# Patient Record
Sex: Female | Born: 1950
Health system: Southern US, Community
[De-identification: ages and names within clinical notes are randomized; demographics above are authoritative.]

## PROBLEM LIST (undated history)

## (undated) DIAGNOSIS — N951 Menopausal and female climacteric states: Secondary | ICD-10-CM

## (undated) DIAGNOSIS — M797 Fibromyalgia: Secondary | ICD-10-CM

## (undated) DIAGNOSIS — E785 Hyperlipidemia, unspecified: Secondary | ICD-10-CM

## (undated) DIAGNOSIS — B029 Zoster without complications: Secondary | ICD-10-CM

## (undated) HISTORY — DX: Fibromyalgia: M79.7

## (undated) HISTORY — DX: Hyperlipidemia, unspecified: E78.5

## (undated) HISTORY — DX: Zoster without complications: B02.9

## (undated) HISTORY — DX: Menopausal and female climacteric states: N95.1

## (undated) HISTORY — PX: KNEE ARTHROSCOPY: SUR90

---

## 1980-02-29 HISTORY — PX: TUBAL LIGATION: SHX77

## 1998-12-30 HISTORY — PX: LUMBAR DISC SURGERY: SHX700

## 1999-01-04 ENCOUNTER — Encounter: Payer: Self-pay | Admitting: Neurosurgery

## 1999-01-04 ENCOUNTER — Ambulatory Visit (HOSPITAL_COMMUNITY): Admission: RE | Admit: 1999-01-04 | Discharge: 1999-01-04 | Payer: Self-pay | Admitting: Neurosurgery

## 1999-04-08 ENCOUNTER — Encounter: Payer: Self-pay | Admitting: Family Medicine

## 1999-04-08 ENCOUNTER — Encounter: Admission: RE | Admit: 1999-04-08 | Discharge: 1999-04-08 | Payer: Self-pay | Admitting: Family Medicine

## 1999-07-05 ENCOUNTER — Other Ambulatory Visit: Admission: RE | Admit: 1999-07-05 | Discharge: 1999-07-05 | Payer: Self-pay | Admitting: Obstetrics and Gynecology

## 1999-07-05 ENCOUNTER — Encounter (INDEPENDENT_AMBULATORY_CARE_PROVIDER_SITE_OTHER): Payer: Self-pay | Admitting: Specialist

## 1999-07-05 HISTORY — PX: COLPOSCOPY W/ BIOPSY / CURETTAGE: SUR283

## 1999-07-22 HISTORY — PX: CERVICAL BIOPSY  W/ LOOP ELECTRODE EXCISION: SUR135

## 1999-07-23 ENCOUNTER — Encounter (INDEPENDENT_AMBULATORY_CARE_PROVIDER_SITE_OTHER): Payer: Self-pay | Admitting: Specialist

## 1999-07-23 ENCOUNTER — Other Ambulatory Visit: Admission: RE | Admit: 1999-07-23 | Discharge: 1999-07-23 | Payer: Self-pay | Admitting: Obstetrics and Gynecology

## 1999-11-19 ENCOUNTER — Other Ambulatory Visit: Admission: RE | Admit: 1999-11-19 | Discharge: 1999-11-19 | Payer: Self-pay | Admitting: Obstetrics and Gynecology

## 2000-03-16 ENCOUNTER — Other Ambulatory Visit: Admission: RE | Admit: 2000-03-16 | Discharge: 2000-03-16 | Payer: Self-pay | Admitting: Obstetrics and Gynecology

## 2000-04-12 ENCOUNTER — Encounter: Payer: Self-pay | Admitting: Obstetrics and Gynecology

## 2000-04-12 ENCOUNTER — Encounter: Admission: RE | Admit: 2000-04-12 | Discharge: 2000-04-12 | Payer: Self-pay | Admitting: Obstetrics and Gynecology

## 2000-07-14 ENCOUNTER — Other Ambulatory Visit: Admission: RE | Admit: 2000-07-14 | Discharge: 2000-07-14 | Payer: Self-pay | Admitting: Obstetrics and Gynecology

## 2000-11-17 ENCOUNTER — Other Ambulatory Visit: Admission: RE | Admit: 2000-11-17 | Discharge: 2000-11-17 | Payer: Self-pay | Admitting: Obstetrics and Gynecology

## 2001-04-02 ENCOUNTER — Other Ambulatory Visit: Admission: RE | Admit: 2001-04-02 | Discharge: 2001-04-02 | Payer: Self-pay | Admitting: Obstetrics and Gynecology

## 2001-04-27 ENCOUNTER — Encounter: Payer: Self-pay | Admitting: Obstetrics and Gynecology

## 2001-04-27 ENCOUNTER — Encounter: Admission: RE | Admit: 2001-04-27 | Discharge: 2001-04-27 | Payer: Self-pay | Admitting: Obstetrics and Gynecology

## 2001-10-12 ENCOUNTER — Other Ambulatory Visit: Admission: RE | Admit: 2001-10-12 | Discharge: 2001-10-12 | Payer: Self-pay | Admitting: Obstetrics and Gynecology

## 2002-04-16 ENCOUNTER — Other Ambulatory Visit: Admission: RE | Admit: 2002-04-16 | Discharge: 2002-04-16 | Payer: Self-pay | Admitting: Obstetrics and Gynecology

## 2002-04-29 ENCOUNTER — Encounter: Payer: Self-pay | Admitting: Obstetrics and Gynecology

## 2002-04-29 ENCOUNTER — Encounter: Admission: RE | Admit: 2002-04-29 | Discharge: 2002-04-29 | Payer: Self-pay | Admitting: Obstetrics and Gynecology

## 2002-10-15 ENCOUNTER — Other Ambulatory Visit: Admission: RE | Admit: 2002-10-15 | Discharge: 2002-10-15 | Payer: Self-pay | Admitting: Obstetrics and Gynecology

## 2003-01-06 ENCOUNTER — Encounter: Admission: RE | Admit: 2003-01-06 | Discharge: 2003-01-06 | Payer: Self-pay | Admitting: Obstetrics and Gynecology

## 2003-04-18 ENCOUNTER — Other Ambulatory Visit: Admission: RE | Admit: 2003-04-18 | Discharge: 2003-04-18 | Payer: Self-pay | Admitting: Obstetrics and Gynecology

## 2003-05-13 ENCOUNTER — Encounter: Admission: RE | Admit: 2003-05-13 | Discharge: 2003-05-13 | Payer: Self-pay | Admitting: Obstetrics and Gynecology

## 2003-10-28 ENCOUNTER — Other Ambulatory Visit: Admission: RE | Admit: 2003-10-28 | Discharge: 2003-10-28 | Payer: Self-pay | Admitting: Obstetrics and Gynecology

## 2004-04-19 ENCOUNTER — Other Ambulatory Visit: Admission: RE | Admit: 2004-04-19 | Discharge: 2004-04-19 | Payer: Self-pay | Admitting: Obstetrics and Gynecology

## 2004-04-20 ENCOUNTER — Ambulatory Visit: Admission: RE | Admit: 2004-04-20 | Discharge: 2004-04-20 | Payer: Self-pay | Admitting: Obstetrics and Gynecology

## 2004-05-26 ENCOUNTER — Encounter: Admission: RE | Admit: 2004-05-26 | Discharge: 2004-05-26 | Payer: Self-pay | Admitting: Obstetrics and Gynecology

## 2005-02-28 DIAGNOSIS — M797 Fibromyalgia: Secondary | ICD-10-CM

## 2005-02-28 HISTORY — DX: Fibromyalgia: M79.7

## 2005-03-29 ENCOUNTER — Encounter: Admission: RE | Admit: 2005-03-29 | Discharge: 2005-03-29 | Payer: Self-pay | Admitting: Neurosurgery

## 2005-04-20 ENCOUNTER — Other Ambulatory Visit: Admission: RE | Admit: 2005-04-20 | Discharge: 2005-04-20 | Payer: Self-pay | Admitting: Obstetrics and Gynecology

## 2005-05-27 ENCOUNTER — Encounter: Admission: RE | Admit: 2005-05-27 | Discharge: 2005-05-27 | Payer: Self-pay | Admitting: Obstetrics and Gynecology

## 2006-02-28 DIAGNOSIS — B029 Zoster without complications: Secondary | ICD-10-CM

## 2006-02-28 HISTORY — DX: Zoster without complications: B02.9

## 2006-04-21 ENCOUNTER — Other Ambulatory Visit: Admission: RE | Admit: 2006-04-21 | Discharge: 2006-04-21 | Payer: Self-pay | Admitting: Obstetrics & Gynecology

## 2006-05-30 ENCOUNTER — Encounter: Admission: RE | Admit: 2006-05-30 | Discharge: 2006-05-30 | Payer: Self-pay | Admitting: Obstetrics and Gynecology

## 2006-11-12 ENCOUNTER — Encounter: Admission: RE | Admit: 2006-11-12 | Discharge: 2006-11-12 | Payer: Self-pay | Admitting: Neurosurgery

## 2007-01-29 HISTORY — PX: LUMBAR DISC ARTHROPLASTY: SHX699

## 2007-02-19 ENCOUNTER — Ambulatory Visit (HOSPITAL_COMMUNITY): Admission: RE | Admit: 2007-02-19 | Discharge: 2007-02-19 | Payer: Self-pay | Admitting: Neurosurgery

## 2007-04-20 ENCOUNTER — Ambulatory Visit (HOSPITAL_COMMUNITY): Admission: RE | Admit: 2007-04-20 | Discharge: 2007-04-20 | Payer: Self-pay | Admitting: Orthopaedic Surgery

## 2007-05-25 ENCOUNTER — Other Ambulatory Visit: Admission: RE | Admit: 2007-05-25 | Discharge: 2007-05-25 | Payer: Self-pay | Admitting: Obstetrics and Gynecology

## 2007-06-04 ENCOUNTER — Encounter: Admission: RE | Admit: 2007-06-04 | Discharge: 2007-06-04 | Payer: Self-pay | Admitting: Obstetrics and Gynecology

## 2007-08-11 DIAGNOSIS — N951 Menopausal and female climacteric states: Secondary | ICD-10-CM

## 2007-08-11 HISTORY — DX: Menopausal and female climacteric states: N95.1

## 2008-05-26 ENCOUNTER — Other Ambulatory Visit: Admission: RE | Admit: 2008-05-26 | Discharge: 2008-05-26 | Payer: Self-pay | Admitting: Obstetrics and Gynecology

## 2008-06-04 ENCOUNTER — Encounter: Admission: RE | Admit: 2008-06-04 | Discharge: 2008-06-04 | Payer: Self-pay | Admitting: Obstetrics and Gynecology

## 2009-06-05 ENCOUNTER — Encounter: Admission: RE | Admit: 2009-06-05 | Discharge: 2009-06-05 | Payer: Self-pay | Admitting: Obstetrics & Gynecology

## 2009-10-28 ENCOUNTER — Encounter (INDEPENDENT_AMBULATORY_CARE_PROVIDER_SITE_OTHER): Payer: Self-pay | Admitting: *Deleted

## 2010-02-28 DIAGNOSIS — E785 Hyperlipidemia, unspecified: Secondary | ICD-10-CM

## 2010-02-28 HISTORY — DX: Hyperlipidemia, unspecified: E78.5

## 2010-03-30 NOTE — Letter (Signed)
Summary: Colonoscopy Date Change Letter  [facility]  [address] [place], [place] [address]   Phone: [phone]  Fax: [phone]      October 28, 2009 MRN: [id number]   [name] [address] [place], [place]  [address]   Dear Ms. REVERON,   Previously you were recommended to have a repeat colonoscopy around this time. Your chart was recently reviewed by Dr. Claudette Head of [facility]. Follow up colonoscopy is now recommended in September 2014. This revised recommendation is based on current, nationally recognized guidelines for colorectal cancer screening and polyp surveillance. These guidelines are endorsed by the American Cancer Society, [employer] on Colorectal Cancer as well as numerous other major medical organizations.  Please understand that our recommendation assumes that you do not have any new symptoms such as bleeding, a change in bowel habits, anemia, or significant abdominal discomfort. If you do have any concerning GI symptoms or want to discuss the guideline recommendations, please call to arrange an office visit at your earliest convenience. Otherwise we will keep you in our reminder system and contact you 1-2 months prior to the date listed above to schedule your next colonoscopy.  Thank you,  Judie Petit T. Russella Dar, M.D.  [facility] [phone]

## 2010-05-03 ENCOUNTER — Other Ambulatory Visit: Payer: Self-pay | Admitting: Nurse Practitioner

## 2010-05-03 DIAGNOSIS — Z1239 Encounter for other screening for malignant neoplasm of breast: Secondary | ICD-10-CM

## 2010-06-07 ENCOUNTER — Other Ambulatory Visit: Payer: Self-pay | Admitting: Obstetrics & Gynecology

## 2010-06-07 ENCOUNTER — Ambulatory Visit
Admission: RE | Admit: 2010-06-07 | Discharge: 2010-06-07 | Disposition: A | Discharge status: Home / Self Care | Payer: BLUE CROSS/BLUE SHIELD | Source: Ambulatory Visit | Attending: Nurse Practitioner | Admitting: Nurse Practitioner

## 2010-06-07 DIAGNOSIS — Z1239 Encounter for other screening for malignant neoplasm of breast: Secondary | ICD-10-CM

## 2010-07-13 NOTE — Op Note (Signed)
NAMEJOSSELYNE, [name]             ACCOUNT NO.:  [id number]   MEDICAL RECORD NO.:  [id number]          PATIENT TYPE:  AMB   LOCATION:  SDS                          FACILITY:  [facility]   PHYSICIAN:  Vanita Panda. Magnus Ivan, M.D.DATE OF BIRTH:  1950/04/10   DATE OF PROCEDURE:  04/20/2007  DATE OF DISCHARGE:  04/20/2007                               OPERATIVE REPORT   PREOPERATIVE DIAGNOSIS:  Right knee complex lateral meniscal tear.   POSTOPERATIVE DIAGNOSIS:  1. Right knee complex lateral meniscal tear.  2. Grade 4 chondromalacia right lateral femoral condyle and right      lateral tibial plateau.   PROCEDURE:  1. Right knee arthroscopy with extensive debridement.  2. Right knee partial lateral meniscectomy.  3. Right knee chondroplasty of lateral compartment.   SURGEON:  Vanita Panda. Magnus Ivan, M.D.   ANESTHESIA:  Local right knee block with IV sedation.   BLOOD LOSS:  Minimal.   COMPLICATIONS:  None.   INDICATIONS:  [name] is a very pleasant 60 year old female who said  that her knee really had not bothered her too much until since Christmas  when she started to have more pain in her knee and she was unable to  extend it and it was in a locked position.  An MRI was obtained and  showed complex degenerative changes in the lateral compartment and a  complex lateral meniscal tear.  Due to the inability to fully extend her  knee, it was recommend she undergo arthroscopic surgery to the knee to  address the lateral compartment.  The risks and benefits of the surgery  were explained to her and she agrees to proceed with surgery.   DESCRIPTION OF PROCEDURE:  After informed consent was obtained and the  appropriate right knee was marked, she was brought to the operating room  and placed supine on the operating table. A block was obtained by  anesthesia previously. IV sedation was obtained.  A non-sterile  tourniquet was placed around her right upper thigh but was never  utilized during the case.  Her right leg was prepped and draped with  DuraPrep and sterile drapes including a sterile stockinette. With the  bed raised and the leg flexed off the side of the table, a lateral leg  post was used, a time out was called, she was identified as the correct  patient and the correct extremity.  I then made an anterolateral portal  just lateral to the patellar tendon at the level of the inferior pole of  the patella.  An arthroscopic cannula was inserted and a large effusion  was encountered.  The arthroscope was inserted in the knee and right  away I went to the medial compartment and made an anteromedial portal.  The medial meniscus was probed as well as the medial compartment in  general and it was found to be without any significant changes. The  cartilage was intact.  The intercondylar area showed an intact ACL and  PCL. With the leg in a figure-of-four position, the lateral compartment  was assessed and found grade 4 chondromalacia of the lateral femoral  condyle  and lateral tibial plateau.  The meniscus was completely torn  from anterior to posterior with a large anterior flap.  A separate  anterior incision was then made for management of compartment changes on  the lateral side. Arthroscopic soft tissue biters were placed to perform  an almost complete meniscectomy, but I still had remnants of the  posterior aspect of the meniscus intact.  I then used a shaver to  perform trimming of the tissue all throughout the compartment of the  knee with extensive debridement.  I performed a chondroplasty, as well,  with loose cartilage edges along the lateral tibial plateau as well as  the lateral femoral condyle.  The patellofemoral joint was assessed and  found to have mild chondromalacia but no free cartilage areas were torn.  I then removed all instrumentation and drained the fluid from the knee.  I then closed the portal sites with interrupted 4-0 nylon suture.   Xeroform was placed over each portal site.  The knee was infiltrated  with a morphine Marcaine mixture which she tolerated well.  A sterile  dressing was applied.  She was taken to the recovery room in stable  condition.  There were no complications noted.  All final counts were  correct.      Vanita Panda. Magnus Ivan, M.D.  Electronically Signed     CYB/MEDQ  D:  04/20/2007  T:  04/21/2007  Job:  [id number]

## 2010-07-13 NOTE — Op Note (Signed)
NAMEARLESIA, [name]             ACCOUNT NO.:  [id number]   MEDICAL RECORD NO.:  [id number]          PATIENT TYPE:  OIB   LOCATION:  3533                         FACILITY:  [facility]   PHYSICIAN:  Henry A. Pool, M.D.    DATE OF BIRTH:  1951/01/31   DATE OF PROCEDURE:  02/19/2007  DATE OF DISCHARGE:                               OPERATIVE REPORT   PREOPERATIVE DIAGNOSIS:  Right L4-5 herniated nucleus pulposus with  radiculopathy.   POSTOPERATIVE DIAGNOSIS:  Right L4-5 herniated nucleus pulposus with  radiculopathy.   PROCEDURE NOTE:  Right L4-5 laminotomy and microdiskectomy.   SURGEON:  Kathaleen Maser. Pool, M.D.   ASSISTANT:  Tia Alert, MD.   ANESTHESIA:  General orotracheal anesthesia.   INDICATIONS FOR PROCEDURE:  [name] is a 60 year old female with  history of back and right lower extremity pain, paresthesias and  weakness with a right-sided L5 radiculopathy.  Workup demonstrates  evidence of right side L4-5 disk herniation with compression of the  right-sided L5 nerve root.  The patient counseled as to her options.  She decided to proceed with a right-sided L4-5 laminotomy and  microdiskectomy in hopes of improving her symptoms.   DESCRIPTION OF PROCEDURE:  Patient placed on the operating room table in  the supine position.  After an adequate level of anesthesia achieved,  the patient was positioned prone onto Wilson frame and appropriately  padded.  The patient's lumbar region is prepped and draped sterilely.  A  10 blade was used to make a linear second incision overlying the L4-5  interspace.  This was carried down sharply in the midline.  A  subperiosteal dissection was then performed exposing the lamina of facet  joints at L4 and L5 on the right side.  Deep self-retaining retractor  was placed.  Intraoperative x-rays taken and level was confirmed.  Laminotomy was then performed using high-speed drill and Kerrison  rongeurs to remove the inferior aspect of the  lamina of L4, medial  aspect of  the L4-5 facet joint and the superior rim of the L5 lamina.  Ligament flavum was elevated and resected in piecemeal fashion using  Kerrison rongeurs.  Underlying thecal sac and exiting L5 nerve root were  identified.  Microscope was then brought into field and used for  microdissection of the right side L5 nerve root, underlying disk  herniation.  Epidural venous plexus coagulated and cut.  Thecal sac and  L5 nerve root were gently mobilized and directed toward the midline.  Disk herniation was then readily apparent but this then incised with a  15 blade in rectangular fashion.  A wide disk space clean-out was then  achieved using pituitary rongeurs, upward angle pituitary rongeurs and  Epstein curettes.  All elements of the disk herniation were completely  resected.  All loose or obviously degenerative disk care was then  removed from the interspace.  At this point, a very thorough diskectomy  had been achieved.  There was no evidence of injury to the thecal sac or  nerve roots.  The wound was then irrigated with antibiotic solution.  Gelfoam was placed  topically for hemostasis and found to be good.  Microscope and retractor system were removed.  Hemostasis in the muscle achieved with electrocautery.  Wound was then  closed in layers with Vicryl suture.  Steri-Strips and sterile dressings  were applied.  There were no apparent complications.  The patient  tolerated the procedure well and she returns to the recovery room  postoperatively.           ______________________________  Kathaleen Maser Pool, M.D.     HAP/MEDQ  D:  02/19/2007  T:  02/19/2007  Job:  [id number]

## 2010-11-19 LAB — CBC
HCT: 36.6
MCV: 93.3
Platelets: 258

## 2010-12-03 LAB — CBC
MCHC: 33.9
RBC: 4.12
RDW: 13.7
WBC: 6.3

## 2010-12-03 LAB — POCT I-STAT 7, (LYTES, BLD GAS, ICA,H+H): Acid-base deficit: 1

## 2010-12-03 LAB — BASIC METABOLIC PANEL
BUN: 12
CO2: 27
Calcium: 9.6
Creatinine, Ser: 0.87
GFR calc Af Amer: >60
GFR calc non Af Amer: >60
Sodium: 140

## 2011-05-05 ENCOUNTER — Other Ambulatory Visit: Payer: Self-pay | Admitting: Obstetrics and Gynecology

## 2011-05-05 DIAGNOSIS — Z1231 Encounter for screening mammogram for malignant neoplasm of breast: Secondary | ICD-10-CM

## 2011-06-02 LAB — HM PAP SMEAR: HM Pap smear: NORMAL

## 2011-06-08 ENCOUNTER — Ambulatory Visit
Admission: RE | Admit: 2011-06-08 | Discharge: 2011-06-08 | Disposition: A | Discharge status: Home / Self Care | Payer: BC Managed Care – PPO | Source: Ambulatory Visit | Attending: Obstetrics and Gynecology | Admitting: Obstetrics and Gynecology

## 2011-06-08 DIAGNOSIS — Z1231 Encounter for screening mammogram for malignant neoplasm of breast: Secondary | ICD-10-CM

## 2012-05-02 ENCOUNTER — Encounter: Payer: Self-pay | Admitting: Nurse Practitioner

## 2012-05-03 ENCOUNTER — Encounter: Payer: Self-pay | Admitting: Nurse Practitioner

## 2012-05-22 ENCOUNTER — Other Ambulatory Visit: Payer: Self-pay

## 2012-05-22 DIAGNOSIS — Z1231 Encounter for screening mammogram for malignant neoplasm of breast: Secondary | ICD-10-CM

## 2012-06-06 ENCOUNTER — Ambulatory Visit: Payer: Self-pay | Admitting: Nurse Practitioner

## 2012-06-26 ENCOUNTER — Ambulatory Visit
Admission: RE | Admit: 2012-06-26 | Discharge: 2012-06-26 | Disposition: A | Discharge status: Home / Self Care | Payer: BC Managed Care – PPO | Source: Ambulatory Visit

## 2012-06-26 DIAGNOSIS — Z1231 Encounter for screening mammogram for malignant neoplasm of breast: Secondary | ICD-10-CM

## 2012-06-28 ENCOUNTER — Ambulatory Visit: Payer: Self-pay | Admitting: Nurse Practitioner

## 2012-07-02 ENCOUNTER — Ambulatory Visit: Payer: Self-pay | Admitting: Nurse Practitioner

## 2012-07-26 ENCOUNTER — Ambulatory Visit (INDEPENDENT_AMBULATORY_CARE_PROVIDER_SITE_OTHER): Payer: BC Managed Care – PPO | Admitting: Nurse Practitioner

## 2012-07-26 ENCOUNTER — Encounter: Payer: Self-pay | Admitting: Nurse Practitioner

## 2012-07-26 VITALS — BP 124/56 | HR 66 | Ht 69.0 in | Wt 180.2 lb

## 2012-07-26 DIAGNOSIS — Z01419 Encounter for gynecological examination (general) (routine) without abnormal findings: Secondary | ICD-10-CM

## 2012-07-26 DIAGNOSIS — Z0000 Encounter for general adult medical examination without abnormal findings: Secondary | ICD-10-CM

## 2012-07-26 LAB — POCT URINALYSIS DIPSTICK
Leukocytes, UA: NEGATIVE
Spec Grav, UA: 1.015
Urobilinogen, UA: NEGATIVE

## 2012-07-26 MED ORDER — PROGESTERONE MICRONIZED 200 MG PO CAPS: 200.0000 mg | ORAL_CAPSULE | Freq: Every day | ORAL | Status: DC

## 2012-07-26 NOTE — Progress Notes (Signed)
61 y.o. G97P2010 Married Caucasian Fe here for annual exam.  No new diagnosis or problems.  She is still on Prometrium 200 mg every other month.  Most recently she has noted no increase in vaso symptoms on the off month.  She is willing to try staying off Prometrium and see how symptoms go.   She will still have RX as back up if needed.  No LMP recorded. Patient is postmenopausal.          Sexually active: yes  The current method of family planning is tubal ligation.    Exercising: yes  walk and weights Smoker:  no  Health Maintenance: Pap:  06/02/2011  Normal (History of CIN II 5/01) MMHG:  05/2012 normal Colonoscopy:  2004 due this September - will get scheduled BMD:   2007 normal TDaP:  03/01/2006 Labs: PCP did lab (blood) work in 03/2012.    reports that she has never smoked. She has never used smokeless tobacco. She reports that she does not drink alcohol or use illicit drugs.  Past Medical History  Diagnosis Date  . Menopausal state 08/11/2007    On Prometrium qomonth  . Shingles 2008    Right side of face & neck tx. w/ prednisone  . Fibromyalgia muscle pain 2007    Past Surgical History  Procedure Laterality Date  . Tubal ligation  1982  . Lumbar disc surgery  12/1998    L5-S1  . Colposcopy w/ biopsy / curettage  07/05/1999    CIN II, ECC benign; DR. LKS  . Cervical biopsy  w/ loop electrode excision  07/22/1999    squamous dysplasia, margins clear; Dr. Ora Mcnatt [place]  . Lumbar disc arthroplasty  01/2007    fusion L4-L5    Current Outpatient Prescriptions  Medication Sig Dispense Refill  . calcium-vitamin D (OSCAL WITH D) 500-200 MG-UNIT per tablet Take 1 tablet by mouth daily.      . cholecalciferol (VITAMIN D) 1000 UNITS tablet Take 1,000 Units by mouth daily.      . fish oil-omega-3 fatty acids 1000 MG capsule Take 2 g by mouth daily.      . pravastatin (PRAVACHOL) 40 MG tablet Take 40 mg by mouth daily.      . progesterone (PROMETRIUM) 200 MG capsule Take 200 mg by mouth daily.  Takes Prometrium daily X 12 days every other month       No current facility-administered medications for this visit.    Family History  Problem Relation Age of Onset  . Diabetes Father   . Diabetes Paternal Grandmother   . Cancer Mother     thymus cancer 2003  . Hypertension Father   . Stroke Maternal Grandmother   . Hyperlipidemia Father     ROS:  Pertinent items are noted in HPI.  Otherwise, a comprehensive ROS was negative.  Exam:   BP 124/56  Pulse 66  Ht 5\' 9"  (1.753 m)  Wt 180 lb 3.2 oz (81.738 kg)  BMI 26.6 kg/m2 Height: 5\' 9"  (175.3 cm)  Ht Readings from Last 3 Encounters:  07/26/12 5\' 9"  (1.753 m)    General appearance: alert, cooperative and appears stated age Head: Normocephalic, without obvious abnormality, atraumatic Neck: no adenopathy, supple, symmetrical, trachea midline and thyroid normal to inspection and palpation Lungs: clear to auscultation bilaterally Breasts: normal appearance, no masses or tenderness Heart: regular rate and rhythm Abdomen: soft, non-tender; no masses,  no organomegaly Extremities: extremities normal, atraumatic, no cyanosis or edema Skin: Skin color, texture, turgor normal.  No rashes or lesions Lymph nodes: Cervical, supraclavicular, and axillary nodes normal. No abnormal inguinal nodes palpated Neurologic: Grossly normal   Pelvic: External genitalia:  no lesions              Urethra:  normal appearing urethra with no masses, tenderness or lesions              Bartholin's and Skene's: normal                 Vagina: normal appearing vagina with normal color and discharge, no lesions              Cervix: anteverted and nabothian cyst              Pap taken: yes Bimanual Exam:  Uterus:  normal size, contour, position, consistency, mobility, non-tender              Adnexa: no mass, fullness, tenderness               Rectovaginal: Confirms               Anus:  normal sphincter tone, no lesions  A:  Well Woman with normal  exam  Postmenopausal with history of significant vaso symptoms that are now improved.  Taking Prometrium every other month - will now try to skip some of the months and see how she does.  History of CIN II with LEEP 5/ 2001 - pap every year For 20 years  P:   Pap smear as per guidelines as noted  Mammogram due again 05/2013  Refill Prometrium even though she is going to try and wean off med's   Patient is aware of potentia side effects and risk of hormonal treatment, DVT,CVA,cancer, etc. return annually or prn  An After Visit Summary was printed and given to the patient.

## 2012-07-26 NOTE — Patient Instructions (Addendum)

## 2012-07-27 NOTE — Progress Notes (Signed)
Reviewed personally.  MSM 

## 2012-08-16 ENCOUNTER — Other Ambulatory Visit: Payer: Self-pay | Admitting: *Deleted

## 2012-09-26 ENCOUNTER — Encounter: Payer: Self-pay | Admitting: Gastroenterology

## 2012-10-17 ENCOUNTER — Encounter: Payer: Self-pay | Admitting: Gastroenterology

## 2013-01-02 ENCOUNTER — Ambulatory Visit (AMBULATORY_SURGERY_CENTER): Payer: Self-pay

## 2013-01-02 VITALS — Ht 70.0 in | Wt 182.0 lb

## 2013-01-02 DIAGNOSIS — Z1211 Encounter for screening for malignant neoplasm of colon: Secondary | ICD-10-CM

## 2013-01-02 MED ORDER — SOD PICOSULFATE-MAG OX-CIT ACD 10-3.5-12 MG-GM-GM PO PACK: 1.0000 | PACK | Freq: Once | ORAL | Status: DC

## 2013-01-04 ENCOUNTER — Encounter: Payer: Self-pay | Admitting: Gastroenterology

## 2013-01-14 ENCOUNTER — Ambulatory Visit (AMBULATORY_SURGERY_CENTER): Payer: BC Managed Care – PPO | Admitting: Gastroenterology

## 2013-01-14 ENCOUNTER — Encounter: Payer: BC Managed Care – PPO | Admitting: Gastroenterology

## 2013-01-14 ENCOUNTER — Encounter: Payer: Self-pay | Admitting: Gastroenterology

## 2013-01-14 VITALS — BP 140/85 | HR 71 | Temp 97.4°F | Resp 21 | Ht 70.0 in | Wt 182.0 lb

## 2013-01-14 DIAGNOSIS — Z1211 Encounter for screening for malignant neoplasm of colon: Secondary | ICD-10-CM

## 2013-01-14 MED ORDER — SODIUM CHLORIDE 0.9 % IV SOLN: 500.0000 mL | INTRAVENOUS | Status: DC

## 2013-01-14 NOTE — Op Note (Signed)
[facility] [address] [place], [address]   COLONOSCOPY PROCEDURE REPORT  PATIENT: [name], [name]  MR#: [id number] BIRTHDATE: 05-May-1950 , 62  yrs. old GENDER: Female ENDOSCOPIST: Meryl Dare, MD, [facility] PROCEDURE DATE:  01/14/2013 PROCEDURE:   Colonoscopy, screening First Screening Colonoscopy - Avg.  risk and is 50 yrs.  old or older - No.  Prior Negative Screening - Now for repeat screening. 10 or more years since last screening  History of Adenoma - Now for follow-up colonoscopy & has been > or = to 3 yrs.  N/A  Polyps Removed Today? No.  Recommend repeat exam, <10 yrs? No. ASA CLASS:   Class II INDICATIONS:average risk screening. MEDICATIONS: MAC sedation, administered by CRNA and propofol (Diprivan) 300mg  IV DESCRIPTION OF PROCEDURE:   After the risks benefits and alternatives of the procedure were thoroughly explained, informed consent was obtained.  A digital rectal exam revealed no abnormalities of the rectum.   The LB [id number]  endoscope was introduced through the anus and advanced to the cecum, which was identified by both the appendix and ileocecal valve. No adverse events experienced.   The quality of the prep was Prepopik good The instrument was then slowly withdrawn as the colon was fully examined.  COLON FINDINGS: Mild diverticulosis was noted in the sigmoid colon. The colon was otherwise normal.  There was no diverticulosis, inflammation, polyps or cancers unless previously stated. Retroflexed views revealed small internal hemorrhoids. The time to cecum=2 minutes 51 seconds.  Withdrawal time=11 minutes 50 seconds. The scope was withdrawn and the procedure completed.  COMPLICATIONS: There were no complications.  ENDOSCOPIC IMPRESSION: 1.   Mild diverticulosis in the sigmoid colon 2.   Small internal hemorrhoids  RECOMMENDATIONS: 1.  High fiber diet with liberal fluid intake. 2.  You should continue to follow colorectal  cancer screening guidelines for "routine risk" patients with a repeat colonoscopy in 10 years.  There is no need for screening FOBT (stool) testing for at least 5 years.  eSigned:  Meryl Dare, MD, [facility] 01/14/2013 9:32 AM   cc: Lupita Raider, MD

## 2013-01-14 NOTE — Progress Notes (Signed)
Patient did not experience any of the following events: a burn prior to discharge; a fall within the facility; wrong site/side/patient/procedure/implant event; or a hospital transfer or hospital admission upon discharge from the facility. (G8907) Patient did not have preoperative order for IV antibiotic SSI prophylaxis. (G8918)  

## 2013-01-14 NOTE — Patient Instructions (Signed)
Discharge instructions given with verbal understanding. Handouts on diverticulosis and hemorrhoids. Resume previous medications. YOU HAD AN ENDOSCOPIC PROCEDURE TODAY AT THE [facility]: Refer to the procedure report that was given to you for any specific questions about what was found during the examination.  If the procedure report does not answer your questions, please call your gastroenterologist to clarify.  If you requested that your care partner not be given the details of your procedure findings, then the procedure report has been included in a sealed envelope for you to review at your convenience later.  YOU SHOULD EXPECT: Some feelings of bloating in the abdomen. Passage of more gas than usual.  Walking can help get rid of the air that was put into your GI tract during the procedure and reduce the bloating. If you had a lower endoscopy (such as a colonoscopy or flexible sigmoidoscopy) you may notice spotting of blood in your stool or on the toilet paper. If you underwent a bowel prep for your procedure, then you may not have a normal bowel movement for a few days.  DIET: Your first meal following the procedure should be a light meal and then it is ok to progress to your normal diet.  A half-sandwich or bowl of soup is an example of a good first meal.  Heavy or fried foods are harder to digest and may make you feel nauseous or bloated.  Likewise meals heavy in dairy and vegetables can cause extra gas to form and this can also increase the bloating.  Drink plenty of fluids but you should avoid alcoholic beverages for 24 hours.  ACTIVITY: Your care partner should take you home directly after the procedure.  You should plan to take it easy, moving slowly for the rest of the day.  You can resume normal activity the day after the procedure however you should NOT DRIVE or use heavy machinery for 24 hours (because of the sedation medicines used during the test).    SYMPTOMS TO REPORT  IMMEDIATELY: A gastroenterologist can be reached at any hour.  During normal business hours, 8:30 AM to 5:00 PM Monday through Friday, call [phone].  After hours and on weekends, please call the GI answering service at [phone] who will take a message and have the physician on call contact you.   Following lower endoscopy (colonoscopy or flexible sigmoidoscopy):  Excessive amounts of blood in the stool  Significant tenderness or worsening of abdominal pains  Swelling of the abdomen that is new, acute  Fever of 100F or higher  FOLLOW UP: If any biopsies were taken you will be contacted by phone or by letter within the next 1-3 weeks.  Call your gastroenterologist if you have not heard about the biopsies in 3 weeks.  Our staff will call the home number listed on your records the next business day following your procedure to check on you and address any questions or concerns that you may have at that time regarding the information given to you following your procedure. This is a courtesy call and so if there is no answer at the home number and we have not heard from you through the emergency physician on call, we will assume that you have returned to your regular daily activities without incident.  SIGNATURES/CONFIDENTIALITY: You and/or your care partner have signed paperwork which will be entered into your electronic medical record.  These signatures attest to the fact that that the information above on your After Visit Summary   has been reviewed and is understood.  Full responsibility of the confidentiality of this discharge information lies with you and/or your care-partner. 

## 2013-01-15 ENCOUNTER — Telehealth: Payer: Self-pay | Admitting: *Deleted

## 2013-01-15 NOTE — Telephone Encounter (Signed)
  Follow up Call-  Call back number 01/14/2013  Post procedure Call Back phone  # [phone]  Permission to leave phone message Yes     Patient questions:  Do you have a fever, pain , or abdominal swelling? no Pain Score  0 *  Have you tolerated food without any problems? yes  Have you been able to return to your normal activities? yes  Do you have any questions about your discharge instructions: Diet   no Medications  no Follow up visit  no  Do you have questions or concerns about your Care? no  Actions: * If pain score is 4 or above: No action needed, pain <4.

## 2013-06-06 ENCOUNTER — Other Ambulatory Visit: Payer: Self-pay

## 2013-06-06 DIAGNOSIS — Z1231 Encounter for screening mammogram for malignant neoplasm of breast: Secondary | ICD-10-CM

## 2013-06-27 ENCOUNTER — Ambulatory Visit
Admission: RE | Admit: 2013-06-27 | Discharge: 2013-06-27 | Disposition: A | Discharge status: Home / Self Care | Payer: 59 | Source: Ambulatory Visit

## 2013-06-27 ENCOUNTER — Ambulatory Visit: Payer: BC Managed Care – PPO

## 2013-06-27 DIAGNOSIS — Z1231 Encounter for screening mammogram for malignant neoplasm of breast: Secondary | ICD-10-CM

## 2013-07-29 ENCOUNTER — Ambulatory Visit (INDEPENDENT_AMBULATORY_CARE_PROVIDER_SITE_OTHER): Payer: 59 | Admitting: Nurse Practitioner

## 2013-07-29 ENCOUNTER — Encounter: Payer: Self-pay | Admitting: Nurse Practitioner

## 2013-07-29 VITALS — BP 130/78 | HR 80 | Ht 69.0 in | Wt 178.0 lb

## 2013-07-29 DIAGNOSIS — Z0000 Encounter for general adult medical examination without abnormal findings: Secondary | ICD-10-CM

## 2013-07-29 DIAGNOSIS — Z01419 Encounter for gynecological examination (general) (routine) without abnormal findings: Secondary | ICD-10-CM

## 2013-07-29 DIAGNOSIS — R87619 Unspecified abnormal cytological findings in specimens from cervix uteri: Secondary | ICD-10-CM

## 2013-07-29 NOTE — Progress Notes (Signed)
Patient ID: [name], female   DOB: 1951-01-01, 63 y.o.   MRN: [id number] 63 y.o. G10P2010 Married Caucasian Fe here for annual exam.  Took Provera last time in March.  She was taking every other month for general vaso symptoms and sense of well being.  The last few times she took the med's notes no difference in how she feels.   So she has been off for several months and has done well.  Some bilateral knee pain on the weekends when she is more active.  Patient's last menstrual period was 07/30/2007.          Sexually active: yes  The current method of family planning is post menopausal status.    Exercising: yes  walking and biking 4-5 days per week Smoker:  no  Health Maintenance: Pap:  07/26/12, WNL, neg HR HPV MMG:  06/27/13, Bi-Rads 1: negative  Colonoscopy:  01/14/13, mild diverticula, repeat 10 years.  No FOBT at least 5 years per GI. BMD:   2007 normal TDaP:  03/01/2006 Labs:  PCP   reports that she has never smoked. She has never used smokeless tobacco. She reports that she does not drink alcohol or use illicit drugs.  Past Medical History  Diagnosis Date  . Menopausal state 08/11/2007    On Prometrium qomonth  . Shingles 2008    Right side of face & neck tx. w/ prednisone  . Fibromyalgia muscle pain 2007  . Hyperlipidemia 2012    Past Surgical History  Procedure Laterality Date  . Tubal ligation  1982  . Lumbar disc surgery  12/1998    L5-S1  . Colposcopy w/ biopsy / curettage  07/05/1999    CIN II, ECC benign; DR. LKS  . Cervical biopsy  w/ loop electrode excision  07/22/1999    squamous dysplasia, margins clear; Dr. Juleen China  . Lumbar disc arthroplasty  01/2007    fusion L4-L5  . Knee arthroscopy      torn ligaments, right    Current Outpatient Prescriptions  Medication Sig Dispense Refill  . amitriptyline (ELAVIL) 10 MG tablet Take 10 mg by mouth at bedtime.      . calcium-vitamin D (OSCAL WITH D) 500-200 MG-UNIT per tablet Take 1 tablet by mouth daily.      .  cholecalciferol (VITAMIN D) 1000 UNITS tablet Take 1,000 Units by mouth daily.      . fish oil-omega-3 fatty acids 1000 MG capsule Take 2 g by mouth daily.      . pravastatin (PRAVACHOL) 40 MG tablet Take 40 mg by mouth daily.      . progesterone (PROMETRIUM) 200 MG capsule Take 1 capsule (200 mg total) by mouth daily. Takes Prometrium daily X 12 days every other month  24 capsule  3   No current facility-administered medications for this visit.    Family History  Problem Relation Age of Onset  . Diabetes Father   . Hypertension Father   . Hyperlipidemia Father   . Diabetes Paternal Grandmother   . Heart failure Paternal Grandmother   . Cancer Mother     thymus cancer 2003  . Stroke Maternal Grandmother   . Colon polyps Maternal Grandmother   . Colon cancer Neg Hx   . Pancreatic cancer Neg Hx   . Stomach cancer Neg Hx   . Rectal cancer Neg Hx     ROS:  Pertinent items are noted in HPI.  Otherwise, a comprehensive ROS was negative.  Exam:   BP  130/78  Pulse 80  Ht 5\' 9"  (1.753 m)  Wt 178 lb (80.74 kg)  BMI 26.27 kg/m2  LMP 07/30/2007 Height: 5\' 9"  (175.3 cm)  Ht Readings from Last 3 Encounters:  07/29/13 5\' 9"  (1.753 m)  01/14/13 5\' 10"  (1.778 m)  01/02/13 5\' 10"  (1.778 m)    General appearance: alert, cooperative and appears stated age Head: Normocephalic, without obvious abnormality, atraumatic Neck: no adenopathy, supple, symmetrical, trachea midline and thyroid normal to inspection and palpation Lungs: clear to auscultation bilaterally Breasts: normal appearance, no masses or tenderness Heart: regular rate and rhythm Abdomen: soft, non-tender; no masses,  no organomegaly Extremities: extremities normal, atraumatic, no cyanosis or edema Skin: Skin color, texture, turgor normal. No rashes or lesions Lymph nodes: Cervical, supraclavicular, and axillary nodes normal. No abnormal inguinal nodes palpated Neurologic: Grossly normal   Pelvic: External genitalia:  no  lesions              Urethra:  normal appearing urethra with no masses, tenderness or lesions              Bartholin's and Skene's: normal                 Vagina: normal appearing vagina with normal color and discharge, no lesions              Cervix: anteverted              Pap taken: yes Bimanual Exam:  Uterus:  normal size, contour, position, consistency, mobility, non-tender              Adnexa: no mass, fullness, tenderness               Rectovaginal: Confirms               Anus:  normal sphincter tone, no lesions  A:  Well Woman with normal exam  Postmenopausal off Prometrium since March  History of CIN II with LEEP 06/1999  CP:   Reviewed health and wellness pertinent to exam  Pap smear taken today repeat until 2021  Mammogram due in May 2016  Dexa order is placed  Counseled on breast self exam, mammography screening, adequate intake of calcium and vitamin D, diet and exercise return annually or prn  An After Visit Summary was printed and given to the patient.

## 2013-07-29 NOTE — Patient Instructions (Signed)

## 2013-07-30 LAB — IPS PAP TEST WITH REFLEX TO HPV

## 2013-08-02 NOTE — Progress Notes (Signed)
Encounter reviewed by Dr. Brook Silva.  

## 2013-12-30 ENCOUNTER — Encounter: Payer: Self-pay | Admitting: Nurse Practitioner

## 2014-02-16 ENCOUNTER — Emergency Department (HOSPITAL_COMMUNITY)
Admission: EM | Admit: 2014-02-16 | Discharge: 2014-02-16 | Disposition: A | Discharge status: Home / Self Care | Payer: 59 | Attending: Emergency Medicine | Admitting: Emergency Medicine

## 2014-02-16 ENCOUNTER — Encounter (HOSPITAL_COMMUNITY): Payer: Self-pay | Admitting: *Deleted

## 2014-02-16 ENCOUNTER — Emergency Department (HOSPITAL_COMMUNITY): Payer: 59

## 2014-02-16 DIAGNOSIS — M797 Fibromyalgia: Secondary | ICD-10-CM | POA: Diagnosis not present

## 2014-02-16 DIAGNOSIS — R61 Generalized hyperhidrosis: Secondary | ICD-10-CM | POA: Diagnosis not present

## 2014-02-16 DIAGNOSIS — N201 Calculus of ureter: Secondary | ICD-10-CM

## 2014-02-16 DIAGNOSIS — R1030 Lower abdominal pain, unspecified: Secondary | ICD-10-CM | POA: Diagnosis present

## 2014-02-16 DIAGNOSIS — R748 Abnormal levels of other serum enzymes: Secondary | ICD-10-CM | POA: Insufficient documentation

## 2014-02-16 DIAGNOSIS — Z79899 Other long term (current) drug therapy: Secondary | ICD-10-CM | POA: Diagnosis not present

## 2014-02-16 DIAGNOSIS — R109 Unspecified abdominal pain: Secondary | ICD-10-CM

## 2014-02-16 LAB — URINALYSIS, ROUTINE W REFLEX MICROSCOPIC
Bilirubin Urine: NEGATIVE
Glucose, UA: NEGATIVE mg/dL
Ketones, ur: 15 mg/dL — AB
LEUKOCYTES UA: NEGATIVE
Nitrite: NEGATIVE
PH: 6.5 (ref 5.0–8.0)
Protein, ur: NEGATIVE mg/dL
SPECIFIC GRAVITY, URINE: 1.02 (ref 1.005–1.030)
Urobilinogen, UA: 0.2 mg/dL (ref 0.0–1.0)

## 2014-02-16 LAB — CBC WITH DIFFERENTIAL/PLATELET
Basophils Absolute: 0 10*3/uL (ref 0.0–0.1)
Basophils Relative: 0 % (ref 0–1)
Eosinophils Absolute: 0 10*3/uL (ref 0.0–0.7)
Eosinophils Relative: 0 % (ref 0–5)
HCT: 39 % (ref 36.0–46.0)
HEMOGLOBIN: 12.7 g/dL (ref 12.0–15.0)
Lymphocytes Relative: 12 % (ref 12–46)
Lymphs Abs: 1.2 10*3/uL (ref 0.7–4.0)
MCH: 30.8 pg (ref 26.0–34.0)
MCHC: 32.6 g/dL (ref 30.0–36.0)
MCV: 94.7 fL (ref 78.0–100.0)
MONO ABS: 0.3 10*3/uL (ref 0.1–1.0)
MONOS PCT: 3 % (ref 3–12)
NEUTROS ABS: 8.6 10*3/uL — AB (ref 1.7–7.7)
Neutrophils Relative %: 85 % — ABNORMAL HIGH (ref 43–77)
Platelets: 256 10*3/uL (ref 150–400)
RBC: 4.12 MIL/uL (ref 3.87–5.11)
RDW: 12.9 % (ref 11.5–15.5)
WBC: 10.1 10*3/uL (ref 4.0–10.5)

## 2014-02-16 LAB — COMPREHENSIVE METABOLIC PANEL
ALBUMIN: 4.2 g/dL (ref 3.5–5.2)
ALK PHOS: 54 U/L (ref 39–117)
ALT: 11 U/L (ref 0–35)
AST: 15 U/L (ref 0–37)
Anion gap: 14 (ref 5–15)
BILIRUBIN TOTAL: 0.3 mg/dL (ref 0.3–1.2)
BUN: 20 mg/dL (ref 6–23)
CHLORIDE: 104 meq/L (ref 96–112)
CO2: 22 mEq/L (ref 19–32)
Calcium: 9.6 mg/dL (ref 8.4–10.5)
Creatinine, Ser: 0.95 mg/dL (ref 0.50–1.10)
GFR calc Af Amer: 73 mL/min — ABNORMAL LOW (ref >90)
GFR calc non Af Amer: 63 mL/min — ABNORMAL LOW (ref >90)
Glucose, Bld: 139 mg/dL — ABNORMAL HIGH (ref 70–99)
POTASSIUM: 4.1 meq/L (ref 3.7–5.3)
SODIUM: 140 meq/L (ref 137–147)
Total Protein: 7.5 g/dL (ref 6.0–8.3)

## 2014-02-16 LAB — URINE MICROSCOPIC-ADD ON

## 2014-02-16 LAB — LIPASE, BLOOD: Lipase: 117 U/L — ABNORMAL HIGH (ref 11–59)

## 2014-02-16 MED ORDER — OXYCODONE-ACETAMINOPHEN 5-325 MG PO TABS: 1.0000 | ORAL_TABLET | ORAL | Status: DC | PRN

## 2014-02-16 MED ORDER — SODIUM CHLORIDE 0.9 % IV SOLN
1000.0000 mL | Freq: Once | INTRAVENOUS | Status: AC
  Administered 2014-02-16: 1000 mL via INTRAVENOUS

## 2014-02-16 MED ORDER — HYDROMORPHONE HCL 1 MG/ML IJ SOLN
1.0000 mg | Freq: Once | INTRAMUSCULAR | Status: AC
  Administered 2014-02-16: 1 mg via INTRAVENOUS
  Filled 2014-02-16: qty 1

## 2014-02-16 MED ORDER — ONDANSETRON HCL 4 MG/2ML IJ SOLN
4.0000 mg | Freq: Once | INTRAMUSCULAR | Status: AC
  Administered 2014-02-16: 4 mg via INTRAVENOUS
  Filled 2014-02-16: qty 2

## 2014-02-16 MED ORDER — ONDANSETRON HCL 4 MG PO TABS: 4.0000 mg | ORAL_TABLET | Freq: Four times a day (QID) | ORAL | Status: DC | PRN

## 2014-02-16 MED ORDER — TAMSULOSIN HCL 0.4 MG PO CAPS: 0.4000 mg | ORAL_CAPSULE | Freq: Every day | ORAL | Status: DC

## 2014-02-16 MED ORDER — SODIUM CHLORIDE 0.9 % IV SOLN
1000.0000 mL | INTRAVENOUS | Status: DC
Start: 2014-02-16 — End: 2014-02-16

## 2014-02-16 NOTE — ED Provider Notes (Signed)
CSN: [id number]     Arrival date & time 02/16/14  0330 History  This chart was scribed for Delora Fuel, MD by Dellis Filbert, ED Scribe. The patient was seen in A05C/A05C and the patient's care was started at 3:59 AM.    Chief Complaint  Patient presents with  . Abdominal Pain   Patient is a 63 y.o. female presenting with abdominal pain. The history is provided by the patient. No language interpreter was used.  Abdominal Pain Associated symptoms: nausea   Associated symptoms: no dysuria     HPI Comments: [name] is a 64 y.o. female who presents to the Emergency Department complaining of right mid, lower abdomen and flank pain onset last night rated as 10/10 at the time of pain and now 6/10. She has nausea and diaphoresis as associated symptoms. Sitting up, a heating pad and pain medication has provided relief. Pt notes having difficulty urinating and when she does urinate there is not much urine. She has never had this pain before. Pt denies dysuria.  Past Medical History  Diagnosis Date  . Menopausal state 08/11/2007    On Prometrium qomonth  . Shingles 2008    Right side of face & neck tx. w/ prednisone  . Fibromyalgia muscle pain 2007  . Hyperlipidemia 2012   Past Surgical History  Procedure Laterality Date  . Tubal ligation  1982  . Lumbar disc surgery  12/1998    L5-S1  . Colposcopy w/ biopsy / curettage  07/05/1999    CIN II, ECC benign; DR. LKS  . Cervical biopsy  w/ loop electrode excision  07/22/1999    squamous dysplasia, margins clear; Dr. Juleen China  . Lumbar disc arthroplasty  01/2007    fusion L4-L5  . Knee arthroscopy      torn ligaments, right   Family History  Problem Relation Age of Onset  . Diabetes Father   . Hypertension Father   . Hyperlipidemia Father   . Diabetes Paternal Grandmother   . Heart failure Paternal Grandmother   . Cancer Mother     thymus cancer 2003  . Stroke Maternal Grandmother   . Colon polyps Maternal Grandmother   . Colon  cancer Neg Hx   . Pancreatic cancer Neg Hx   . Stomach cancer Neg Hx   . Rectal cancer Neg Hx    History  Substance Use Topics  . Smoking status: Never Smoker   . Smokeless tobacco: Never Used  . Alcohol Use: No   OB History    Gravida Para Term Preterm AB TAB SAB Ectopic Multiple Living   3 2 2  1  1         Review of Systems  Constitutional: Positive for diaphoresis.  Gastrointestinal: Positive for nausea and abdominal pain.  Genitourinary: Positive for flank pain and decreased urine volume. Negative for dysuria.  All other systems reviewed and are negative.  Allergies  Review of patient's allergies indicates no known allergies.  Home Medications   Prior to Admission medications   Medication Sig Start Date End Date Taking? Authorizing Provider  amitriptyline (ELAVIL) 10 MG tablet Take 10 mg by mouth at bedtime.    Historical Provider, MD  calcium-vitamin D (OSCAL WITH D) 500-200 MG-UNIT per tablet Take 1 tablet by mouth daily.    Historical Provider, MD  cholecalciferol (VITAMIN D) 1000 UNITS tablet Take 1,000 Units by mouth daily.    Historical Provider, MD  fish oil-omega-3 fatty acids 1000 MG capsule Take 2 g  by mouth daily.    Historical Provider, MD  pravastatin (PRAVACHOL) 40 MG tablet Take 40 mg by mouth daily.    Historical Provider, MD   BP 164/78 mmHg  Pulse 84  Temp(Src) 97.9 F (36.6 C) (Oral)  Resp 18  Ht 5\' 11"  (1.803 m)  Wt 175 lb (79.379 kg)  BMI 24.42 kg/m2  SpO2 97%  LMP 07/30/2007 Physical Exam  Constitutional: She is oriented to person, place, and time. She appears well-developed and well-nourished. No distress.  HENT:  Head: Normocephalic and atraumatic.  Eyes: EOM are normal. Pupils are equal, round, and reactive to light.  Neck: Normal range of motion. Neck supple. No JVD present.  Cardiovascular: Normal rate, regular rhythm and normal heart sounds.   No murmur heard. Pulmonary/Chest: Effort normal and breath sounds normal. She has no  wheezes. She has no rales.  Abdominal: Soft. She exhibits no distension and no mass. Bowel sounds are decreased. There is CVA tenderness. There is no rebound and no guarding.  Mild right cva tenderness Moderate RLQ tenderness   Musculoskeletal: Normal range of motion. She exhibits no edema.  Lymphadenopathy:    She has no cervical adenopathy.  Neurological: She is alert and oriented to person, place, and time. No cranial nerve deficit. Coordination normal.  Skin: Skin is warm and dry. No rash noted.  Psychiatric: She has a normal mood and affect. Her behavior is normal. Judgment and thought content normal.  Nursing note and vitals reviewed.   ED Course  Procedures  DIAGNOSTIC STUDIES: Oxygen Saturation is 97% on room air, normal by my interpretation.    COORDINATION OF CARE: 4:07 AM Discussed treatment plan with pt at bedside and pt agreed to plan.   Labs Review Results for orders placed or performed during the hospital encounter of 02/16/14  Urinalysis, Routine w reflex microscopic  Result Value Ref Range   Color, Urine YELLOW YELLOW   APPearance CLOUDY (A) CLEAR   Specific Gravity, Urine 1.020 1.005 - 1.030   pH 6.5 5.0 - 8.0   Glucose, UA NEGATIVE NEGATIVE mg/dL   Hgb urine dipstick SMALL (A) NEGATIVE   Bilirubin Urine NEGATIVE NEGATIVE   Ketones, ur 15 (A) NEGATIVE mg/dL   Protein, ur NEGATIVE NEGATIVE mg/dL   Urobilinogen, UA 0.2 0.0 - 1.0 mg/dL   Nitrite NEGATIVE NEGATIVE   Leukocytes, UA NEGATIVE NEGATIVE  Comprehensive metabolic panel  Result Value Ref Range   Sodium 140 137 - 147 mEq/L   Potassium 4.1 3.7 - 5.3 mEq/L   Chloride 104 96 - 112 mEq/L   CO2 22 19 - 32 mEq/L   Glucose, Bld 139 (H) 70 - 99 mg/dL   BUN 20 6 - 23 mg/dL   Creatinine, Ser 0.95 0.50 - 1.10 mg/dL   Calcium 9.6 8.4 - 10.5 mg/dL   Total Protein 7.5 6.0 - 8.3 g/dL   Albumin 4.2 3.5 - 5.2 g/dL   AST 15 0 - 37 U/L   ALT 11 0 - 35 U/L   Alkaline Phosphatase 54 39 - 117 U/L   Total  Bilirubin 0.3 0.3 - 1.2 mg/dL   GFR calc non Af Amer 63 (L) >90 mL/min   GFR calc Af Amer 73 (L) >90 mL/min   Anion gap 14 5 - 15  Lipase, blood  Result Value Ref Range   Lipase 117 (H) 11 - 59 U/L  CBC with Differential  Result Value Ref Range   WBC 10.1 4.0 - 10.5 K/uL   RBC 4.12  3.87 - 5.11 MIL/uL   Hemoglobin 12.7 12.0 - 15.0 g/dL   HCT 39.0 36.0 - 46.0 %   MCV 94.7 78.0 - 100.0 fL   MCH 30.8 26.0 - 34.0 pg   MCHC 32.6 30.0 - 36.0 g/dL   RDW 12.9 11.5 - 15.5 %   Platelets 256 150 - 400 K/uL   Neutrophils Relative % 85 (H) 43 - 77 %   Neutro Abs 8.6 (H) 1.7 - 7.7 K/uL   Lymphocytes Relative 12 12 - 46 %   Lymphs Abs 1.2 0.7 - 4.0 K/uL   Monocytes Relative 3 3 - 12 %   Monocytes Absolute 0.3 0.1 - 1.0 K/uL   Eosinophils Relative 0 0 - 5 %   Eosinophils Absolute 0.0 0.0 - 0.7 K/uL   Basophils Relative 0 0 - 1 %   Basophils Absolute 0.0 0.0 - 0.1 K/uL  Urine microscopic-add on  Result Value Ref Range   Squamous Epithelial / LPF FEW (A) RARE   WBC, UA 0-2 <3 WBC/hpf   RBC / HPF 3-6 <3 RBC/hpf   Bacteria, UA FEW (A) RARE   Urine-Other MUCOUS PRESENT    Imaging Review Ct Renal Stone Study  02/16/2014   CLINICAL DATA:  Acute onset of right flank pain.  Initial encounter.  EXAM: CT ABDOMEN AND PELVIS WITHOUT CONTRAST  TECHNIQUE: Multidetector CT imaging of the abdomen and pelvis was performed following the standard protocol without IV contrast.  COMPARISON:  MRI of the lumbar spine performed 01/10/2014  FINDINGS: The visualized lung bases are clear.  A calcified granuloma is noted in the periphery of the liver. The liver and spleen are otherwise unremarkable. The gallbladder is within normal limits. The pancreas and adrenal glands are unremarkable.  There is mild to moderate right-sided hydronephrosis, with perinephric stranding and fluid, and an obstructing 7 mm stone noted distally at the right vesicoureteral junction. Scattered nonobstructing bilateral renal stones are seen,  measuring up to 3 mm in size. A few left renal parapelvic cysts are noted. The left kidney is otherwise unremarkable.  No free fluid is identified. The small bowel is unremarkable in appearance. The stomach is within normal limits. No acute vascular abnormalities are seen. Mild scattered calcification is seen along the abdominal aorta.  The appendix is normal in caliber, without evidence for appendicitis. Scattered diverticulosis is noted along the descending and proximal sigmoid colon, without evidence of diverticulitis. The colon is otherwise unremarkable.  The bladder is mildly distended and grossly unremarkable in appearance. The uterus is within normal limits. The ovaries are relatively symmetric. No suspicious adnexal masses are seen. No inguinal lymphadenopathy is seen.  No acute osseous abnormalities are identified. Facet disease is noted at the lower lumbar spine. There is multilevel disc space narrowing at the lower lumbar spine, with associated vacuum phenomenon.  IMPRESSION: 1. Mild to moderate right-sided hydronephrosis, with an obstructing 7 mm stone distally at the right vesicoureteral junction. 2. Scattered nonobstructing bilateral renal stones seen, measuring up to 3 mm in size. 3. Few left renal parapelvic cysts noted. 4. Mild scattered calcification along the abdominal aorta. 5. Scattered diverticulosis along the descending and proximal sigmoid colon, without evidence of diverticulitis.   Electronically Signed   By: Garald Balding M.D.   On: 02/16/2014 06:01    Images viewed by me.   MDM   Final diagnoses:  Right flank pain  Ureterolithiasis  Elevated lipase    Right flank and lower abdominal pain in pattern suspicious for renal  colic. She is sent for CT scan which confirms presence of a 7 mm distal right ureteral calculus. She was given IV fluids, hydromorphone, ondansetron good relief of pain and nausea. Laboratory workup shows moderately elevated lipase of uncertain medical  significance. She clinically does not have pancreatitis and CT scan showed no evidence of pancreatic inflammation. She is also noted to have mild hyperglycemia with blood sugar 139. This may represent pre-diabetes and will need to be followed as an outpatient. She is referred to urology for follow-up. She is sent home with prescriptions for oxycodone acetaminophen, ondansetron, and tamsulosin.  I personally performed the services described in this documentation, which was scribed in my presence. The recorded information has been reviewed and is accurate.      Delora Fuel, MD 76/54/65 [id number]

## 2014-02-16 NOTE — ED Notes (Signed)
Patient presents with c/o right abd pain that started about 2210 last night  Denies painful urination but states that she only goes a little at a time.

## 2014-02-16 NOTE — Discharge Instructions (Signed)
Your blood sugar was slightly high today - 139. This needs to be watched closely to make sure you do not develop diabetes.  Return if pain is not being adequately controlled, or if you develop a fever.  Kidney Stones Kidney stones (urolithiasis) are deposits that form inside your kidneys. The intense pain is caused by the stone moving through the urinary tract. When the stone moves, the ureter goes into spasm around the stone. The stone is usually passed in the urine.  CAUSES   A disorder that makes certain neck glands produce too much parathyroid hormone (primary hyperparathyroidism).  A buildup of uric acid crystals, similar to gout in your joints.  Narrowing (stricture) of the ureter.  A kidney obstruction present at birth (congenital obstruction).  Previous surgery on the kidney or ureters.  Numerous kidney infections. SYMPTOMS   Feeling sick to your stomach (nauseous).  Throwing up (vomiting).  Blood in the urine (hematuria).  Pain that usually spreads (radiates) to the groin.  Frequency or urgency of urination. DIAGNOSIS   Taking a history and physical exam.  Blood or urine tests.  CT scan.  Occasionally, an examination of the inside of the urinary bladder (cystoscopy) is performed. TREATMENT   Observation.  Increasing your fluid intake.  Extracorporeal shock wave lithotripsy--This is a noninvasive procedure that uses shock waves to break up kidney stones.  Surgery may be needed if you have severe pain or persistent obstruction. There are various surgical procedures. Most of the procedures are performed with the use of small instruments. Only small incisions are needed to accommodate these instruments, so recovery time is minimized. The size, location, and chemical composition are all important variables that will determine the proper choice of action for you. Talk to your health care provider to better understand your situation so that you will minimize the risk  of injury to yourself and your kidney.  HOME CARE INSTRUCTIONS   Drink enough water and fluids to keep your urine clear or pale yellow. This will help you to pass the stone or stone fragments.  Strain all urine through the provided strainer. Keep all particulate matter and stones for your health care provider to see. The stone causing the pain may be as small as a grain of salt. It is very important to use the strainer each and every time you pass your urine. The collection of your stone will allow your health care provider to analyze it and verify that a stone has actually passed. The stone analysis will often identify what you can do to reduce the incidence of recurrences.  Only take over-the-counter or prescription medicines for pain, discomfort, or fever as directed by your health care provider.  Make a follow-up appointment with your health care provider as directed.  Get follow-up X-rays if required. The absence of pain does not always mean that the stone has passed. It may have only stopped moving. If the urine remains completely obstructed, it can cause loss of kidney function or even complete destruction of the kidney. It is your responsibility to make sure X-rays and follow-ups are completed. Ultrasounds of the kidney can show blockages and the status of the kidney. Ultrasounds are not associated with any radiation and can be performed easily in a matter of minutes. SEEK MEDICAL CARE IF:  You experience pain that is progressive and unresponsive to any pain medicine you have been prescribed. SEEK IMMEDIATE MEDICAL CARE IF:   Pain cannot be controlled with the prescribed medicine.  You have  a fever or shaking chills.  The severity or intensity of pain increases over 18 hours and is not relieved by pain medicine.  You develop a new onset of abdominal pain.  You feel faint or pass out.  You are unable to urinate. MAKE SURE YOU:   Understand these instructions.  Will watch your  condition.  Will get help right away if you are not doing well or get worse. Document Released: 02/14/2005 Document Revised: 10/17/2012 Document Reviewed: 07/18/2012 [facility] Patient Information 2015 [place], [place]. This information is not intended to replace advice given to you by your health care provider. Make sure you discuss any questions you have with your health care provider.  Tamsulosin capsules What is this medicine? TAMSULOSIN (tam SOO loe sin) is used to treat enlargement of the prostate gland in men, a condition called benign prostatic hyperplasia or BPH. It is not for use in women. It works by relaxing muscles in the prostate and bladder neck. This improves urine flow and reduces BPH symptoms. This medicine may be used for other purposes; ask your health care provider or pharmacist if you have questions. COMMON BRAND NAME(S): Flomax What should I tell my health care provider before I take this medicine? They need to know if you have any of the following conditions: -advanced kidney disease -advanced liver disease -low blood pressure -prostate cancer -an unusual or allergic reaction to tamsulosin, sulfa drugs, other medicines, foods, dyes, or preservatives -pregnant or trying to get pregnant -breast-feeding How should I use this medicine? Take this medicine by mouth about 30 minutes after the same meal every day. Follow the directions on the prescription label. Swallow the capsules whole with a glass of water. Do not crush, chew, or open capsules. Do not take your medicine more often than directed. Do not stop taking your medicine unless your doctor tells you to. Talk to your pediatrician regarding the use of this medicine in children. Special care may be needed. Overdosage: If you think you have taken too much of this medicine contact a poison control center or emergency room at once. NOTE: This medicine is only for you. Do not share this medicine with others. What if I miss a  dose? If you miss a dose, take it as soon as you can. If it is almost time for your next dose, take only that dose. Do not take double or extra doses. If you stop taking your medicine for several days or more, ask your doctor or health care professional what dose you should start back on. What may interact with this medicine? -cimetidine -fluoxetine -ketoconazole -medicines for erectile disfunction like sildenafil, tadalafil, vardenafil -medicines for high blood pressure -other alpha-blockers like alfuzosin, doxazosin, phentolamine, phenoxybenzamine, prazosin, terazosin -warfarin This list may not describe all possible interactions. Give your health care provider a list of all the medicines, herbs, non-prescription drugs, or dietary supplements you use. Also tell them if you smoke, drink alcohol, or use illegal drugs. Some items may interact with your medicine. What should I watch for while using this medicine? Visit your doctor or health care professional for regular check ups. You will need lab work done before you start this medicine and regularly while you are taking it. Check your blood pressure as directed. Ask your health care professional what your blood pressure should be, and when you should contact him or her. This medicine may make you feel dizzy or lightheaded. This is more likely to happen after the first dose, after an increase  in dose, or during hot weather or exercise. Drinking alcohol and taking some medicines can make this worse. Do not drive, use machinery, or do anything that needs mental alertness until you know how this medicine affects you. Do not sit or stand up quickly. If you begin to feel dizzy, sit down until you feel better. These effects can decrease once your body adjusts to the medicine. Contact your doctor or health care professional right away if you have an erection that lasts longer than 4 hours or if it becomes painful. This may be a sign of a serious problem and  must be treated right away to prevent permanent damage. If you are thinking of having cataract surgery, tell your eye surgeon that you have taken this medicine. What side effects may I notice from receiving this medicine? Side effects that you should report to your doctor or health care professional as soon as possible: -allergic reactions like skin rash or itching, hives, swelling of the lips, mouth, tongue, or throat -breathing problems -change in vision -feeling faint or lightheaded -irregular heartbeat -prolonged or painful erection -weakness Side effects that usually do not require medical attention (report to your doctor or health care professional if they continue or are bothersome): -back pain -change in sex drive or performance -constipation, nausea or vomiting -cough -drowsy -runny or stuffy nose -trouble sleeping This list may not describe all possible side effects. Call your doctor for medical advice about side effects. You may report side effects to FDA at 1-800-FDA-1088. Where should I keep my medicine? Keep out of the reach of children. Store at room temperature between 15 and 30 degrees C (59 and 86 degrees F). Throw away any unused medicine after the expiration date. NOTE: This sheet is a summary. It may not cover all possible information. If you have questions about this medicine, talk to your doctor, pharmacist, or health care provider.  2015, [employer] Standard. (2012-02-15 14:11:34)  Acetaminophen; Oxycodone tablets What is this medicine? ACETAMINOPHEN; OXYCODONE (a set a MEE noe fen; ox i KOE done) is a pain reliever. It is used to treat mild to moderate pain. This medicine may be used for other purposes; ask your health care provider or pharmacist if you have questions. COMMON BRAND NAME(S): Endocet, Magnacet, Narvox, Percocet, Perloxx, Primalev, Primlev, Roxicet, Xolox What should I tell my health care provider before I take this medicine? They need to  know if you have any of these conditions: -brain tumor -Crohn's disease, inflammatory bowel disease, or ulcerative colitis -drug abuse or addiction -head injury -heart or circulation problems -if you often drink alcohol -kidney disease or problems going to the bathroom -liver disease -lung disease, asthma, or breathing problems -an unusual or allergic reaction to acetaminophen, oxycodone, other opioid analgesics, other medicines, foods, dyes, or preservatives -pregnant or trying to get pregnant -breast-feeding How should I use this medicine? Take this medicine by mouth with a full glass of water. Follow the directions on the prescription label. Take your medicine at regular intervals. Do not take your medicine more often than directed. Talk to your pediatrician regarding the use of this medicine in children. Special care may be needed. Patients over 10 years old may have a stronger reaction and need a smaller dose. Overdosage: If you think you have taken too much of this medicine contact a poison control center or emergency room at once. NOTE: This medicine is only for you. Do not share this medicine with others. What if I miss a dose? If  you miss a dose, take it as soon as you can. If it is almost time for your next dose, take only that dose. Do not take double or extra doses. What may interact with this medicine? -alcohol -antihistamines -barbiturates like amobarbital, butalbital, butabarbital, methohexital, pentobarbital, phenobarbital, thiopental, and secobarbital -benztropine -drugs for bladder problems like solifenacin, trospium, oxybutynin, tolterodine, hyoscyamine, and methscopolamine -drugs for breathing problems like ipratropium and tiotropium -drugs for certain stomach or intestine problems like propantheline, homatropine methylbromide, glycopyrrolate, atropine, belladonna, and dicyclomine -general anesthetics like etomidate, ketamine, nitrous oxide, propofol, desflurane,  enflurane, halothane, isoflurane, and sevoflurane -medicines for depression, anxiety, or psychotic disturbances -medicines for sleep -muscle relaxants -naltrexone -narcotic medicines (opiates) for pain -phenothiazines like perphenazine, thioridazine, chlorpromazine, mesoridazine, fluphenazine, prochlorperazine, promazine, and trifluoperazine -scopolamine -tramadol -trihexyphenidyl This list may not describe all possible interactions. Give your health care provider a list of all the medicines, herbs, non-prescription drugs, or dietary supplements you use. Also tell them if you smoke, drink alcohol, or use illegal drugs. Some items may interact with your medicine. What should I watch for while using this medicine? Tell your doctor or health care professional if your pain does not go away, if it gets worse, or if you have new or a different type of pain. You may develop tolerance to the medicine. Tolerance means that you will need a higher dose of the medication for pain relief. Tolerance is normal and is expected if you take this medicine for a long time. Do not suddenly stop taking your medicine because you may develop a severe reaction. Your body becomes used to the medicine. This does NOT mean you are addicted. Addiction is a behavior related to getting and using a drug for a non-medical reason. If you have pain, you have a medical reason to take pain medicine. Your doctor will tell you how much medicine to take. If your doctor wants you to stop the medicine, the dose will be slowly lowered over time to avoid any side effects. You may get drowsy or dizzy. Do not drive, use machinery, or do anything that needs mental alertness until you know how this medicine affects you. Do not stand or sit up quickly, especially if you are an older patient. This reduces the risk of dizzy or fainting spells. Alcohol may interfere with the effect of this medicine. Avoid alcoholic drinks. There are different types of  narcotic medicines (opiates) for pain. If you take more than one type at the same time, you may have more side effects. Give your health care provider a list of all medicines you use. Your doctor will tell you how much medicine to take. Do not take more medicine than directed. Call emergency for help if you have problems breathing. The medicine will cause constipation. Try to have a bowel movement at least every 2 to 3 days. If you do not have a bowel movement for 3 days, call your doctor or health care professional. Do not take Tylenol (acetaminophen) or medicines that have acetaminophen with this medicine. Too much acetaminophen can be very dangerous. Many nonprescription medicines contain acetaminophen. Always read the labels carefully to avoid taking more acetaminophen. What side effects may I notice from receiving this medicine? Side effects that you should report to your doctor or health care professional as soon as possible: -allergic reactions like skin rash, itching or hives, swelling of the face, lips, or tongue -breathing difficulties, wheezing -confusion -light headedness or fainting spells -severe stomach pain -unusually weak or tired -yellowing  of the skin or the whites of the eyes Side effects that usually do not require medical attention (report to your doctor or health care professional if they continue or are bothersome): -dizziness -drowsiness -nausea -vomiting This list may not describe all possible side effects. Call your doctor for medical advice about side effects. You may report side effects to FDA at 1-800-FDA-1088. Where should I keep my medicine? Keep out of the reach of children. This medicine can be abused. Keep your medicine in a safe place to protect it from theft. Do not share this medicine with anyone. Selling or giving away this medicine is dangerous and against the law. Store at room temperature between 20 and 25 degrees C (68 and 77 degrees F). Keep container  tightly closed. Protect from light. This medicine may cause accidental overdose and death if it is taken by other adults, children, or pets. Flush any unused medicine down the toilet to reduce the chance of harm. Do not use the medicine after the expiration date. NOTE: This sheet is a summary. It may not cover all possible information. If you have questions about this medicine, talk to your doctor, pharmacist, or health care provider.  2015, [employer] Standard. (2012-10-08 13:17:35)  Ondansetron tablets What is this medicine? ONDANSETRON (on DAN se tron) is used to treat nausea and vomiting caused by chemotherapy. It is also used to prevent or treat nausea and vomiting after surgery. This medicine may be used for other purposes; ask your health care provider or pharmacist if you have questions. COMMON BRAND NAME(S): Zofran What should I tell my health care provider before I take this medicine? They need to know if you have any of these conditions: -heart disease -history of irregular heartbeat -liver disease -low levels of magnesium or potassium in the blood -an unusual or allergic reaction to ondansetron, granisetron, other medicines, foods, dyes, or preservatives -pregnant or trying to get pregnant -breast-feeding How should I use this medicine? Take this medicine by mouth with a glass of water. Follow the directions on your prescription label. Take your doses at regular intervals. Do not take your medicine more often than directed. Talk to your pediatrician regarding the use of this medicine in children. Special care may be needed. Overdosage: If you think you have taken too much of this medicine contact a poison control center or emergency room at once. NOTE: This medicine is only for you. Do not share this medicine with others. What if I miss a dose? If you miss a dose, take it as soon as you can. If it is almost time for your next dose, take only that dose. Do not take double or  extra doses. What may interact with this medicine? Do not take this medicine with any of the following medications: -apomorphine -certain medicines for fungal infections like fluconazole, itraconazole, ketoconazole, posaconazole, voriconazole -cisapride -dofetilide -dronedarone -pimozide -thioridazine -ziprasidone This medicine may also interact with the following medications: -carbamazepine -certain medicines for depression, anxiety, or psychotic disturbances -fentanyl -linezolid -MAOIs like Carbex, Eldepryl, Marplan, Nardil, and Parnate -methylene blue (injected into a vein) -other medicines that prolong the QT interval (cause an abnormal heart rhythm) -phenytoin -rifampicin -tramadol This list may not describe all possible interactions. Give your health care provider a list of all the medicines, herbs, non-prescription drugs, or dietary supplements you use. Also tell them if you smoke, drink alcohol, or use illegal drugs. Some items may interact with your medicine. What should I watch for while using this medicine? Check  with your doctor or health care professional right away if you have any sign of an allergic reaction. What side effects may I notice from receiving this medicine? Side effects that you should report to your doctor or health care professional as soon as possible: -allergic reactions like skin rash, itching or hives, swelling of the face, lips or tongue -breathing problems -confusion -dizziness -fast or irregular heartbeat -feeling faint or lightheaded, falls -fever and chills -loss of balance or coordination -seizures -sweating -swelling of the hands or feet -tightness in the chest -tremors -unusually weak or tired Side effects that usually do not require medical attention (report to your doctor or health care professional if they continue or are bothersome): -constipation or diarrhea -headache This list may not describe all possible side effects. Call  your doctor for medical advice about side effects. You may report side effects to FDA at 1-800-FDA-1088. Where should I keep my medicine? Keep out of the reach of children. Store between 2 and 30 degrees C (36 and 86 degrees F). Throw away any unused medicine after the expiration date. NOTE: This sheet is a summary. It may not cover all possible information. If you have questions about this medicine, talk to your doctor, pharmacist, or health care provider.  2015, [employer] Standard. (2012-11-21 16:27:45)

## 2014-02-19 ENCOUNTER — Other Ambulatory Visit: Payer: Self-pay | Admitting: Urology

## 2014-02-26 NOTE — Progress Notes (Signed)
Spoke with Mrs. Watkin's and she states she has passed her stone however she will see Dr. Tresa Moore in the morning for xray to verify.  She will call us or come to short stay tomorrow if procedure remains scheduled so that we may go over pre-op instructions with her.  She has our number and verbalizes understanding.

## 2014-02-28 HISTORY — PX: LUMBAR DISC SURGERY: SHX700

## 2014-03-03 ENCOUNTER — Encounter (HOSPITAL_COMMUNITY): Admission: RE | Payer: Self-pay | Source: Ambulatory Visit

## 2014-03-03 ENCOUNTER — Ambulatory Visit (HOSPITAL_COMMUNITY): Admission: RE | Admit: 2014-03-03 | Payer: 59 | Source: Ambulatory Visit | Admitting: Urology

## 2014-03-03 SURGERY — LITHOTRIPSY, ESWL
Anesthesia: LOCAL | Laterality: Right

## 2014-07-07 ENCOUNTER — Other Ambulatory Visit: Payer: Self-pay

## 2014-07-07 DIAGNOSIS — Z1231 Encounter for screening mammogram for malignant neoplasm of breast: Secondary | ICD-10-CM

## 2014-07-18 ENCOUNTER — Ambulatory Visit: Payer: Self-pay

## 2014-07-21 ENCOUNTER — Ambulatory Visit
Admission: RE | Admit: 2014-07-21 | Discharge: 2014-07-21 | Disposition: A | Discharge status: Home / Self Care | Payer: 59 | Source: Ambulatory Visit

## 2014-07-21 DIAGNOSIS — Z1231 Encounter for screening mammogram for malignant neoplasm of breast: Secondary | ICD-10-CM

## 2014-07-31 ENCOUNTER — Ambulatory Visit: Payer: 59 | Admitting: Nurse Practitioner

## 2014-08-05 ENCOUNTER — Ambulatory Visit: Payer: Self-pay | Admitting: Nurse Practitioner

## 2014-08-18 ENCOUNTER — Ambulatory Visit (INDEPENDENT_AMBULATORY_CARE_PROVIDER_SITE_OTHER): Payer: 59 | Admitting: Nurse Practitioner

## 2014-08-18 ENCOUNTER — Encounter: Payer: Self-pay | Admitting: Nurse Practitioner

## 2014-08-18 VITALS — BP 112/72 | HR 68 | Ht 69.0 in | Wt 183.0 lb

## 2014-08-18 DIAGNOSIS — Z0000 Encounter for general adult medical examination without abnormal findings: Secondary | ICD-10-CM | POA: Diagnosis not present

## 2014-08-18 DIAGNOSIS — Z01419 Encounter for gynecological examination (general) (routine) without abnormal findings: Secondary | ICD-10-CM | POA: Diagnosis not present

## 2014-08-18 LAB — POCT URINALYSIS DIPSTICK
BILIRUBIN UA: NEGATIVE
Blood, UA: NEGATIVE
Glucose, UA: NEGATIVE
Ketones, UA: NEGATIVE
Leukocytes, UA: NEGATIVE
Nitrite, UA: NEGATIVE
PROTEIN UA: NEGATIVE
Urobilinogen, UA: NEGATIVE
pH, UA: 6

## 2014-08-18 NOTE — Patient Instructions (Signed)

## 2014-08-18 NOTE — Progress Notes (Signed)
Patient ID: ASA FATH, female   DOB: Apr 20, 1950, 64 y.o.   MRN: [id number] 64 y.o. [id number] Married  Caucasian Fe here for annual exam.  Rare vaso symptoms..  Some vaginal dryness treated with OTC lubrication.  Epidural X 2 this year with no help on numbness and pain.  Then had back surgery mid January.  She also had right kidney stone 1/ 2016 and passed.  Patient's last menstrual period was 07/30/2007.          Sexually active: Yes.    The current method of family planning is post menopausal status.    Exercising: Yes.    Gym/ health club routine includes cardio, light weights and biking. Smoker:  no  Health Maintenance: Pap:  07/29/13, Negative (Neg HR HPV 06/2012, CIN-II with LEEP 06/1999) MMHG:  07/21/14, Bi-Rads 1: Negative  Colonoscopy:  01/14/13, mild divertic, repeat 10 years.  No FOBT at least 5 years per GI. BMD:   2007 normal TDaP:  03/01/2006 Labs:  HB:  12.7 01/2014 in EPIC  Urine:  Negative     reports that she has never smoked. She has never used smokeless tobacco. She reports that she does not drink alcohol or use illicit drugs.  Past Medical History  Diagnosis Date  . Menopausal state 08/11/2007    On Prometrium qomonth  . Shingles 2008    Right side of face & neck tx. w/ prednisone  . Fibromyalgia muscle pain 2007  . Hyperlipidemia 2012    Past Surgical History  Procedure Laterality Date  . Tubal ligation  1982  . Lumbar disc surgery  12/1998    L5-S1  . Colposcopy w/ biopsy / curettage  07/05/1999    CIN II, ECC benign; DR. LKS  . Cervical biopsy  w/ loop electrode excision  07/22/1999    squamous dysplasia, margins clear; Dr. Juleen China  . Lumbar disc arthroplasty  01/2007    fusion L4-L5  . Knee arthroscopy      torn ligaments, right  . Lumbar disc surgery  02/2014    bone spurs and fusion of F5    Current Outpatient Prescriptions  Medication Sig Dispense Refill  . amitriptyline (ELAVIL) 50 MG tablet Take 1 tablet by mouth daily.    . Bisacodyl (LAXATIVE PO)  Take 1 tablet by mouth daily as needed (constipation).    . calcium-vitamin D (OSCAL WITH D) 500-200 MG-UNIT per tablet Take 1 tablet by mouth daily.    . cholecalciferol (VITAMIN D) 1000 UNITS tablet Take 1,000 Units by mouth daily.    . Cyanocobalamin (VITAMIN B-12 PO) Take 1 tablet by mouth daily.    . fish oil-omega-3 fatty acids 1000 MG capsule Take 2 g by mouth daily.    . Glucosamine HCl (GLUCOSAMINE PO) Take 1 tablet by mouth daily.    . pravastatin (PRAVACHOL) 40 MG tablet Take 40 mg by mouth daily.     No current facility-administered medications for this visit.    Family History  Problem Relation Age of Onset  . Diabetes Father   . Hypertension Father   . Hyperlipidemia Father   . Diabetes Paternal Grandmother   . Heart failure Paternal Grandmother   . Cancer Mother     thymus cancer 2003  . Stroke Maternal Grandmother   . Colon polyps Maternal Grandmother   . Colon cancer Neg Hx   . Pancreatic cancer Neg Hx   . Stomach cancer Neg Hx   . Rectal cancer Neg Hx  ROS:  Pertinent items are noted in HPI.  Otherwise, a comprehensive ROS was negative.  Exam:   BP 112/72 mmHg  Pulse 68  Ht 5\' 9"  (1.753 m)  Wt 183 lb (83.008 kg)  BMI 27.01 kg/m2  LMP 07/30/2007 Height: 5\' 9"  (175.3 cm) Ht Readings from Last 3 Encounters:  08/18/14 5\' 9"  (1.753 m)  02/16/14 5\' 11"  (1.803 m)  07/29/13 5\' 9"  (1.753 m)    General appearance: alert, cooperative and appears stated age Head: Normocephalic, without obvious abnormality, atraumatic Neck: no adenopathy, supple, symmetrical, trachea midline and thyroid normal to inspection and palpation Lungs: clear to auscultation bilaterally Breasts: normal appearance, no masses or tenderness Heart: regular rate and rhythm Abdomen: soft, non-tender; no masses,  no organomegaly Extremities: extremities normal, atraumatic, no cyanosis or edema Skin: Skin color, texture, turgor normal. No rashes or lesions Lymph nodes: Cervical,  supraclavicular, and axillary nodes normal. No abnormal inguinal nodes palpated Neurologic: Grossly normal   Pelvic: External genitalia:  no lesions              Urethra:  normal appearing urethra with no masses, tenderness or lesions              Bartholin's and Skene's: normal                 Vagina: normal appearing vagina with normal color and discharge, no lesions              Cervix: anteverted              Pap taken: Yes.   Bimanual Exam:  Uterus:  normal size, contour, position, consistency, mobility, non-tender              Adnexa: no mass, fullness, tenderness               Rectovaginal: Confirms               Anus:  normal sphincter tone, no lesions  Chaperone present:  yes  A:  Well Woman with normal exam  Postmenopausal off Prometrium since March 2015 History of CIN II with LEEP 06/1999  Atrophic vaginitis  S/P lumbar Diskectomy 02/2014  Renal Calculi 02/2014   P:   Reviewed health and wellness pertinent to exam  Pap smear as above  Mammogram is due 06/2015  Counseled on breast self exam, mammography screening, adequate intake of calcium and vitamin D, diet and exercise, Kegel's exercises return annually or prn  An After Visit Summary was printed and given to the patient.

## 2014-08-20 LAB — IPS PAP TEST WITH HPV

## 2014-08-21 NOTE — Progress Notes (Signed)
Encounter reviewed by Dr. Brook Silva.  

## 2015-06-29 ENCOUNTER — Other Ambulatory Visit: Payer: Self-pay

## 2015-06-29 DIAGNOSIS — Z1231 Encounter for screening mammogram for malignant neoplasm of breast: Secondary | ICD-10-CM

## 2015-07-23 ENCOUNTER — Ambulatory Visit
Admission: RE | Admit: 2015-07-23 | Discharge: 2015-07-23 | Disposition: A | Discharge status: Home / Self Care | Payer: 59 | Source: Ambulatory Visit

## 2015-07-23 DIAGNOSIS — Z1231 Encounter for screening mammogram for malignant neoplasm of breast: Secondary | ICD-10-CM

## 2015-08-19 ENCOUNTER — Ambulatory Visit (INDEPENDENT_AMBULATORY_CARE_PROVIDER_SITE_OTHER): Payer: 59 | Admitting: Nurse Practitioner

## 2015-08-19 ENCOUNTER — Encounter: Payer: Self-pay | Admitting: Nurse Practitioner

## 2015-08-19 VITALS — BP 118/78 | HR 76 | Resp 14 | Ht 68.75 in | Wt 183.8 lb

## 2015-08-19 DIAGNOSIS — Z0000 Encounter for general adult medical examination without abnormal findings: Secondary | ICD-10-CM

## 2015-08-19 DIAGNOSIS — Z8742 Personal history of other diseases of the female genital tract: Secondary | ICD-10-CM | POA: Diagnosis not present

## 2015-08-19 DIAGNOSIS — Z8639 Personal history of other endocrine, nutritional and metabolic disease: Secondary | ICD-10-CM

## 2015-08-19 DIAGNOSIS — Z01419 Encounter for gynecological examination (general) (routine) without abnormal findings: Secondary | ICD-10-CM | POA: Diagnosis not present

## 2015-08-19 DIAGNOSIS — E559 Vitamin D deficiency, unspecified: Secondary | ICD-10-CM | POA: Diagnosis not present

## 2015-08-19 DIAGNOSIS — Z9223 Personal history of estrogen therapy: Secondary | ICD-10-CM

## 2015-08-19 NOTE — Patient Instructions (Signed)

## 2015-08-19 NOTE — Progress Notes (Signed)
Encounter reviewed Jill Jertson, MD   

## 2015-08-19 NOTE — Progress Notes (Signed)
65 y.o. [id number] Married  Caucasian Fe here for annual exam.  Doing well in general.  Some vaginal dryness but using OTC lubrication prn.  Has completed 3 epidural steroids.  And then back surgery a few yrs ago and no problems since then.  Some right knee pain and had knee scope.  58 yo granddaughter and the family is going to [place] to a water park in a few weeks.  Patient's last menstrual period was 07/30/2007.          Sexually active: Yes.    The current method of family planning is post menopausal status.    Exercising: Yes.    Walking, Weight Lifting Smoker:  no  Health Maintenance: Pap:  08/18/14-WNL with negative HR HPV; H/O of CIN II/III MMG: 07/24/15-BIRADS1 Colonoscopy:  01/14/13-Mild Diverticulitis. Repeat 10 yrs  Will do IFOB next year BMD:   06/01/05-Normal order to repeat at [facility] TDaP:  01/2007 Shingles: 03/31/2012 Hep C and HIV: done today Labs: PCP   reports that she has never smoked. She has never used smokeless tobacco. She reports that she does not drink alcohol or use illicit drugs.  Past Medical History  Diagnosis Date  . Menopausal state 08/11/2007    On Prometrium qomonth  . Shingles 2008    Right side of face & neck tx. w/ prednisone  . Fibromyalgia muscle pain 2007  . Hyperlipidemia 2012    Past Surgical History  Procedure Laterality Date  . Tubal ligation  1982  . Lumbar disc surgery  12/1998    L5-S1  . Colposcopy w/ biopsy / curettage  07/05/1999    CIN II, ECC benign; DR. LKS  . Cervical biopsy  w/ loop electrode excision  07/22/1999    squamous dysplasia, margins clear; Dr. Juleen China  . Lumbar disc arthroplasty  01/2007    fusion L4-L5  . Knee arthroscopy      torn ligaments, right  . Lumbar disc surgery  02/2014    bone spurs and fusion of F5    Current Outpatient Prescriptions  Medication Sig Dispense Refill  . amitriptyline (ELAVIL) 50 MG tablet Take 1 tablet by mouth daily.    . Bisacodyl (LAXATIVE PO) Take 1 tablet by mouth daily as needed  (constipation).    . calcium-vitamin D (OSCAL WITH D) 500-200 MG-UNIT per tablet Take 1 tablet by mouth daily.    . cholecalciferol (VITAMIN D) 1000 UNITS tablet Take 1,000 Units by mouth daily.    . Cyanocobalamin (VITAMIN B-12 PO) Take 1 tablet by mouth daily.    . fish oil-omega-3 fatty acids 1000 MG capsule Take 2 g by mouth daily.    . Glucosamine HCl (GLUCOSAMINE PO) Take 1 tablet by mouth daily.    . pravastatin (PRAVACHOL) 40 MG tablet Take 40 mg by mouth daily.     No current facility-administered medications for this visit.    Family History  Problem Relation Age of Onset  . Diabetes Father   . Hypertension Father   . Hyperlipidemia Father   . Diabetes Paternal Grandmother   . Heart failure Paternal Grandmother   . Cancer Mother     thymus cancer 2003  . Stroke Maternal Grandmother   . Colon polyps Maternal Grandmother   . Colon cancer Neg Hx   . Pancreatic cancer Neg Hx   . Stomach cancer Neg Hx   . Rectal cancer Neg Hx     ROS:  Pertinent items are noted in HPI.  Otherwise, a comprehensive ROS  was negative.  Exam:   BP 118/78 mmHg  Pulse 76  Resp 14  Ht 5' 8.75" (1.746 m)  Wt 183 lb 12.8 oz (83.371 kg)  BMI 27.35 kg/m2  LMP 07/30/2007 Height: 5' 8.75" (174.6 cm) Ht Readings from Last 3 Encounters:  08/19/15 5' 8.75" (1.746 m)  08/18/14 5\' 9"  (1.753 m)  02/16/14 5\' 11"  (1.803 m)    General appearance: alert, cooperative and appears stated age Head: Normocephalic, without obvious abnormality, atraumatic Neck: no adenopathy, supple, symmetrical, trachea midline and thyroid normal to inspection and palpation Lungs: clear to auscultation bilaterally Breasts: normal appearance, no masses or tenderness Heart: regular rate and rhythm Abdomen: soft, non-tender; no masses,  no organomegaly Extremities: extremities normal, atraumatic, no cyanosis or edema Skin: Skin color, texture, turgor normal. No rashes or lesions Lymph nodes: Cervical, supraclavicular, and  axillary nodes normal. No abnormal inguinal nodes palpated Neurologic: Grossly normal   Pelvic: External genitalia:  no lesions              Urethra:  normal appearing urethra with no masses, tenderness or lesions              Bartholin's and Skene's: normal                 Vagina: normal appearing vagina with normal color and discharge, no lesions              Cervix: anteverted              Pap taken: Yes.   Bimanual Exam:  Uterus:  normal size, contour, position, consistency, mobility, non-tender              Adnexa: no mass, fullness, tenderness               Rectovaginal: Confirms               Anus:  normal sphincter tone, no lesions  Chaperone present: yes  A:  Well Woman with normal exam  Postmenopausal off Prometrium since March 2015 History of CIN II with LEEP 06/1999 Atrophic vaginitis S/P lumbar Diskectomy 02/2014 Renal Calculi 02/2014   P:   Reviewed health and wellness pertinent to exam  Pap smear as above  Mammogram is due 06/2016  Will follow with labs  Order for BMD  Counseled on breast self exam, mammography screening, use and side effects of HRT, adequate intake of calcium and vitamin D, diet and exercise return annually or prn  An After Visit Summary was printed and given to the patient.

## 2015-08-20 LAB — HIV ANTIBODY (ROUTINE TESTING W REFLEX): HIV 1&2 Ab, 4th Generation: NONREACTIVE

## 2015-08-20 LAB — VITAMIN D 25 HYDROXY (VIT D DEFICIENCY, FRACTURES): VIT D 25 HYDROXY: 45 ng/mL (ref 30–100)

## 2015-08-20 LAB — HEPATITIS C ANTIBODY: HCV AB: NEGATIVE

## 2015-08-21 LAB — IPS PAP TEST WITH HPV

## 2015-08-25 ENCOUNTER — Other Ambulatory Visit: Payer: Self-pay | Admitting: Nurse Practitioner

## 2015-08-25 DIAGNOSIS — E2839 Other primary ovarian failure: Secondary | ICD-10-CM

## 2015-09-23 ENCOUNTER — Ambulatory Visit
Admission: RE | Admit: 2015-09-23 | Discharge: 2015-09-23 | Disposition: A | Discharge status: Home / Self Care | Payer: 59 | Source: Ambulatory Visit | Attending: Nurse Practitioner | Admitting: Nurse Practitioner

## 2015-09-23 DIAGNOSIS — E2839 Other primary ovarian failure: Secondary | ICD-10-CM

## 2016-06-28 ENCOUNTER — Other Ambulatory Visit: Payer: Self-pay | Admitting: Nurse Practitioner

## 2016-06-28 DIAGNOSIS — Z1231 Encounter for screening mammogram for malignant neoplasm of breast: Secondary | ICD-10-CM

## 2016-07-27 ENCOUNTER — Ambulatory Visit: Payer: 59

## 2016-08-09 ENCOUNTER — Ambulatory Visit
Admission: RE | Admit: 2016-08-09 | Discharge: 2016-08-09 | Disposition: A | Discharge status: Home / Self Care | Payer: BLUE CROSS/BLUE SHIELD | Source: Ambulatory Visit | Attending: Nurse Practitioner | Admitting: Nurse Practitioner

## 2016-08-09 DIAGNOSIS — Z1231 Encounter for screening mammogram for malignant neoplasm of breast: Secondary | ICD-10-CM

## 2016-08-22 ENCOUNTER — Ambulatory Visit (INDEPENDENT_AMBULATORY_CARE_PROVIDER_SITE_OTHER): Payer: BLUE CROSS/BLUE SHIELD | Admitting: Nurse Practitioner

## 2016-08-22 ENCOUNTER — Encounter: Payer: Self-pay | Admitting: Nurse Practitioner

## 2016-08-22 VITALS — BP 126/80 | HR 72 | Ht 68.25 in | Wt 178.0 lb

## 2016-08-22 DIAGNOSIS — Z01419 Encounter for gynecological examination (general) (routine) without abnormal findings: Secondary | ICD-10-CM | POA: Diagnosis not present

## 2016-08-22 DIAGNOSIS — E559 Vitamin D deficiency, unspecified: Secondary | ICD-10-CM

## 2016-08-22 DIAGNOSIS — Z0000 Encounter for general adult medical examination without abnormal findings: Secondary | ICD-10-CM | POA: Diagnosis not present

## 2016-08-22 NOTE — Progress Notes (Signed)
Patient ID: [name], female   DOB: [birth year], 66 y.o.   MRN: [id number]  66 y.o. [id number] Married  Caucasian Fe here for annual exam.  Some vaginal dryness and using OTC lubrication.  Stopped Elavil, after tapering over 6 months.  Sleep is OK   She was taking this for possible fibromyalgia.  Due for lab check in August with PCP.  Off cholesterol med's X 4 months.  Patient's last menstrual period was 07/30/2007 (approximate).          Sexually active: Yes.    The current method of family planning is post menopausal status.    Exercising: Yes.    walking and lifting weights Smoker:  no  Health Maintenance: Pap: 08/19/15, Negative with neg HR HPV  08/18/14, Negative with neg HR HPV  07/29/13, Negative  07/26/12, Negative with neg HR HPV History of Abnormal Pap: yes, 2001 CIN II/III MMG: 08/09/16, 3D-yes, Density Category B, Bi-Rads 1:  Negative Self Breast exams: yes Colonoscopy: 01/14/13, mild diverticula, repeat in 10 years BMD: 09/23/15 T Score: 0.2 Spine / -1.3 Right Femur Neck / -0.9 Left Femur Neck TDaP: 03/01/06 ? Maybe done at PCP will check Shingles: had Zostavax but will go to pharmacy for Shingrix Pneumonia: Not indicated due to age 19 C and HIV: 08/19/15 Labs: PCP takes care of screening labs    reports that she has never smoked. She has never used smokeless tobacco. She reports that she does not drink alcohol or use drugs.  Past Medical History:  Diagnosis Date  . Fibromyalgia muscle pain 2007  . Hyperlipidemia 2012  . Menopausal state 08/11/2007   On Prometrium qomonth  . Shingles 2008   Right side of face & neck tx. w/ prednisone    Past Surgical History:  Procedure Laterality Date  . CERVICAL BIOPSY  W/ LOOP ELECTRODE EXCISION  07/22/1999   squamous dysplasia, margins clear; Dr. Juleen China  . COLPOSCOPY W/ BIOPSY / CURETTAGE  07/05/1999   CIN II, ECC benign; DR. LKS  . KNEE ARTHROSCOPY     torn ligaments, right  . LUMBAR DISC ARTHROPLASTY  01/2007   fusion L4-L5  .  LUMBAR DISC SURGERY  12/1998   L5-S1  . LUMBAR DISC SURGERY  02/2014   bone spurs and fusion of F5  . TUBAL LIGATION  1982    Current Outpatient Prescriptions  Medication Sig Dispense Refill  . Bisacodyl (LAXATIVE PO) Take 1 tablet by mouth daily as needed (constipation).    . calcium-vitamin D (OSCAL WITH D) 500-200 MG-UNIT per tablet Take 1 tablet by mouth daily.    . cholecalciferol (VITAMIN D) 1000 UNITS tablet Take 1,000 Units by mouth daily.    . Cyanocobalamin (VITAMIN B-12 PO) Take 1 tablet by mouth daily.    . fish oil-omega-3 fatty acids 1000 MG capsule Take 2 g by mouth daily.    . Glucosamine HCl (GLUCOSAMINE PO) Take 1 tablet by mouth daily.     No current facility-administered medications for this visit.     Family History  Problem Relation Age of Onset  . Diabetes Father   . Hypertension Father   . Hyperlipidemia Father   . Diabetes Paternal Grandmother   . Heart failure Paternal Grandmother   . Cancer Mother        thymus cancer 2003  . Stroke Maternal Grandmother   . Colon polyps Maternal Grandmother   . Colon cancer Neg Hx   . Pancreatic cancer Neg Hx   . Stomach cancer  Neg Hx   . Rectal cancer Neg Hx     ROS:  Pertinent items are noted in HPI.  Otherwise, a comprehensive ROS was negative.  Exam:   BP 126/80 (BP Location: Right Arm, Patient Position: Sitting, Cuff Size: Large)   Pulse 72   Ht 5' 8.25" (1.734 m)   Wt 178 lb (80.7 kg)   LMP 07/30/2007 (Approximate)   BMI 26.87 kg/m  Height: 5' 8.25" (173.4 cm) Ht Readings from Last 3 Encounters:  08/22/16 5' 8.25" (1.734 m)  08/19/15 5' 8.75" (1.746 m)  08/18/14 5\' 9"  (1.753 m)    General appearance: alert, cooperative and appears stated age Head: Normocephalic, without obvious abnormality, atraumatic Neck: no adenopathy, supple, symmetrical, trachea midline and thyroid normal to inspection and palpation Lungs: clear to auscultation bilaterally Breasts: normal appearance, no masses or  tenderness Heart: regular rate and rhythm Abdomen: soft, non-tender; no masses,  no organomegaly Extremities: extremities normal, atraumatic, no cyanosis or edema Skin: Skin color, texture, turgor normal. No rashes or lesions Lymph nodes: Cervical, supraclavicular, and axillary nodes normal. No abnormal inguinal nodes palpated Neurologic: Grossly normal   Pelvic: External genitalia:  no lesions              Urethra:  normal appearing urethra with no masses, tenderness or lesions              Bartholin's and Skene's: normal                 Vagina: normal appearing vagina with normal color and discharge, no lesions              Cervix: anteverted              Pap taken: No. Bimanual Exam:  Uterus:  normal size, contour, position, consistency, mobility, non-tender              Adnexa: no mass, fullness, tenderness               Rectovaginal: Confirms               Anus:  normal sphincter tone, no lesions  Chaperone present: no  A:  Well Woman with normal exam  Postmenopausal   Remote history of CIN II with LEEP 06/1999 - normal since  Atrophic vaginitis  History of hypercholesterolemia - followed by PCP  P:   Reviewed health and wellness pertinent to exam  Pap smear: no  Mammogram is due 07/2017  Counseled on breast self exam, mammography screening, adequate intake of calcium and vitamin D, diet and exercise, Kegel's exercises return annually or prn  An After Visit Summary was printed and given to the patient.

## 2016-08-22 NOTE — Patient Instructions (Addendum)

## 2016-08-24 NOTE — Progress Notes (Signed)
Encounter reviewed Isaid Salvia, MD   

## 2016-09-30 ENCOUNTER — Telehealth: Payer: Self-pay | Admitting: Obstetrics and Gynecology

## 2016-09-30 NOTE — Telephone Encounter (Signed)
Attempted to reschedule patient [name] appointment.  Patient declined states she will see her PCP for her gynecology needs.

## 2016-12-19 ENCOUNTER — Other Ambulatory Visit (HOSPITAL_COMMUNITY): Payer: Self-pay | Admitting: Orthopedic Surgery

## 2016-12-19 DIAGNOSIS — M79604 Pain in right leg: Secondary | ICD-10-CM

## 2016-12-19 DIAGNOSIS — M7989 Other specified soft tissue disorders: Secondary | ICD-10-CM

## 2016-12-20 ENCOUNTER — Ambulatory Visit (HOSPITAL_COMMUNITY)
Admission: RE | Admit: 2016-12-20 | Discharge: 2016-12-20 | Disposition: A | Discharge status: Home / Self Care | Payer: BLUE CROSS/BLUE SHIELD | Source: Ambulatory Visit | Attending: Orthopedic Surgery | Admitting: Orthopedic Surgery

## 2016-12-20 ENCOUNTER — Encounter (INDEPENDENT_AMBULATORY_CARE_PROVIDER_SITE_OTHER): Payer: Self-pay

## 2016-12-20 DIAGNOSIS — M7989 Other specified soft tissue disorders: Secondary | ICD-10-CM | POA: Insufficient documentation

## 2016-12-20 DIAGNOSIS — M79604 Pain in right leg: Secondary | ICD-10-CM | POA: Insufficient documentation

## 2016-12-20 NOTE — Progress Notes (Signed)
*  PRELIMINARY RESULTS* Vascular Ultrasound Right lower extremity venous duplex has been completed.  Preliminary findings: No evidence of deep vein thrombosis or baker's cyst in the right lower extremity. Preliminary results called to Dr. Charlestine Night office, given to [facility] @ 13:15  [name] 12/20/2016, 1:27 PM

## 2017-05-29 DIAGNOSIS — L309 Dermatitis, unspecified: Secondary | ICD-10-CM | POA: Diagnosis not present

## 2017-07-06 ENCOUNTER — Other Ambulatory Visit: Payer: Self-pay | Admitting: Family Medicine

## 2017-07-06 DIAGNOSIS — Z1231 Encounter for screening mammogram for malignant neoplasm of breast: Secondary | ICD-10-CM

## 2017-08-23 ENCOUNTER — Ambulatory Visit: Payer: BLUE CROSS/BLUE SHIELD | Admitting: Nurse Practitioner

## 2017-08-25 ENCOUNTER — Ambulatory Visit: Payer: BLUE CROSS/BLUE SHIELD

## 2017-08-28 ENCOUNTER — Ambulatory Visit
Admission: RE | Admit: 2017-08-28 | Discharge: 2017-08-28 | Disposition: A | Discharge status: Home / Self Care | Payer: Medicare HMO | Source: Ambulatory Visit | Attending: Family Medicine | Admitting: Family Medicine

## 2017-08-28 DIAGNOSIS — Z1231 Encounter for screening mammogram for malignant neoplasm of breast: Secondary | ICD-10-CM | POA: Diagnosis not present

## 2017-09-07 DIAGNOSIS — M1711 Unilateral primary osteoarthritis, right knee: Secondary | ICD-10-CM | POA: Diagnosis not present

## 2017-10-31 DIAGNOSIS — M797 Fibromyalgia: Secondary | ICD-10-CM | POA: Diagnosis not present

## 2017-10-31 DIAGNOSIS — Z0000 Encounter for general adult medical examination without abnormal findings: Secondary | ICD-10-CM | POA: Diagnosis not present

## 2017-10-31 DIAGNOSIS — Z1159 Encounter for screening for other viral diseases: Secondary | ICD-10-CM | POA: Diagnosis not present

## 2017-10-31 DIAGNOSIS — Z23 Encounter for immunization: Secondary | ICD-10-CM | POA: Diagnosis not present

## 2017-10-31 DIAGNOSIS — R7309 Other abnormal glucose: Secondary | ICD-10-CM | POA: Diagnosis not present

## 2017-10-31 DIAGNOSIS — E7800 Pure hypercholesterolemia, unspecified: Secondary | ICD-10-CM | POA: Diagnosis not present

## 2017-10-31 DIAGNOSIS — L409 Psoriasis, unspecified: Secondary | ICD-10-CM | POA: Diagnosis not present

## 2017-10-31 DIAGNOSIS — E559 Vitamin D deficiency, unspecified: Secondary | ICD-10-CM | POA: Diagnosis not present

## 2017-12-04 DIAGNOSIS — H524 Presbyopia: Secondary | ICD-10-CM | POA: Diagnosis not present

## 2018-01-02 DIAGNOSIS — Z780 Asymptomatic menopausal state: Secondary | ICD-10-CM | POA: Diagnosis not present

## 2018-01-02 DIAGNOSIS — M8588 Other specified disorders of bone density and structure, other site: Secondary | ICD-10-CM | POA: Diagnosis not present

## 2018-03-01 DIAGNOSIS — L219 Seborrheic dermatitis, unspecified: Secondary | ICD-10-CM | POA: Diagnosis not present

## 2018-03-01 DIAGNOSIS — L309 Dermatitis, unspecified: Secondary | ICD-10-CM | POA: Diagnosis not present

## 2018-03-30 DIAGNOSIS — L309 Dermatitis, unspecified: Secondary | ICD-10-CM | POA: Diagnosis not present

## 2018-04-11 DIAGNOSIS — L282 Other prurigo: Secondary | ICD-10-CM | POA: Diagnosis not present

## 2018-04-18 DIAGNOSIS — L308 Other specified dermatitis: Secondary | ICD-10-CM | POA: Diagnosis not present

## 2018-04-18 DIAGNOSIS — N179 Acute kidney failure, unspecified: Secondary | ICD-10-CM | POA: Diagnosis not present

## 2018-04-18 DIAGNOSIS — L299 Pruritus, unspecified: Secondary | ICD-10-CM | POA: Diagnosis not present

## 2018-04-18 DIAGNOSIS — D0461 Carcinoma in situ of skin of right upper limb, including shoulder: Secondary | ICD-10-CM | POA: Diagnosis not present

## 2018-05-09 DIAGNOSIS — L308 Other specified dermatitis: Secondary | ICD-10-CM | POA: Diagnosis not present

## 2018-05-09 DIAGNOSIS — L821 Other seborrheic keratosis: Secondary | ICD-10-CM | POA: Diagnosis not present

## 2018-06-01 DIAGNOSIS — J3089 Other allergic rhinitis: Secondary | ICD-10-CM | POA: Diagnosis not present

## 2018-06-01 DIAGNOSIS — R21 Rash and other nonspecific skin eruption: Secondary | ICD-10-CM | POA: Diagnosis not present

## 2018-06-01 DIAGNOSIS — J301 Allergic rhinitis due to pollen: Secondary | ICD-10-CM | POA: Diagnosis not present

## 2018-06-01 DIAGNOSIS — J3081 Allergic rhinitis due to animal (cat) (dog) hair and dander: Secondary | ICD-10-CM | POA: Diagnosis not present

## 2018-07-09 DIAGNOSIS — L239 Allergic contact dermatitis, unspecified cause: Secondary | ICD-10-CM | POA: Diagnosis not present

## 2018-07-11 DIAGNOSIS — L503 Dermatographic urticaria: Secondary | ICD-10-CM | POA: Diagnosis not present

## 2018-07-11 DIAGNOSIS — L308 Other specified dermatitis: Secondary | ICD-10-CM | POA: Diagnosis not present

## 2018-07-12 DIAGNOSIS — L235 Allergic contact dermatitis due to other chemical products: Secondary | ICD-10-CM | POA: Diagnosis not present

## 2018-08-22 ENCOUNTER — Other Ambulatory Visit: Payer: Self-pay | Admitting: Family Medicine

## 2018-08-22 DIAGNOSIS — Z1231 Encounter for screening mammogram for malignant neoplasm of breast: Secondary | ICD-10-CM

## 2018-09-26 DIAGNOSIS — R21 Rash and other nonspecific skin eruption: Secondary | ICD-10-CM | POA: Diagnosis not present

## 2018-09-26 DIAGNOSIS — R69 Illness, unspecified: Secondary | ICD-10-CM | POA: Diagnosis not present

## 2018-09-26 DIAGNOSIS — E663 Overweight: Secondary | ICD-10-CM | POA: Diagnosis not present

## 2018-09-26 DIAGNOSIS — L401 Generalized pustular psoriasis: Secondary | ICD-10-CM | POA: Diagnosis not present

## 2018-09-26 DIAGNOSIS — Z6826 Body mass index (BMI) 26.0-26.9, adult: Secondary | ICD-10-CM | POA: Diagnosis not present

## 2018-10-03 ENCOUNTER — Other Ambulatory Visit: Payer: Self-pay

## 2018-10-03 ENCOUNTER — Ambulatory Visit
Admission: RE | Admit: 2018-10-03 | Discharge: 2018-10-03 | Disposition: A | Discharge status: Home / Self Care | Payer: Medicare HMO | Source: Ambulatory Visit | Attending: Family Medicine | Admitting: Family Medicine

## 2018-10-03 DIAGNOSIS — Z1231 Encounter for screening mammogram for malignant neoplasm of breast: Secondary | ICD-10-CM

## 2018-11-07 DIAGNOSIS — L401 Generalized pustular psoriasis: Secondary | ICD-10-CM | POA: Diagnosis not present

## 2018-11-07 DIAGNOSIS — Z6826 Body mass index (BMI) 26.0-26.9, adult: Secondary | ICD-10-CM | POA: Diagnosis not present

## 2018-11-07 DIAGNOSIS — E663 Overweight: Secondary | ICD-10-CM | POA: Diagnosis not present

## 2018-11-07 DIAGNOSIS — R21 Rash and other nonspecific skin eruption: Secondary | ICD-10-CM | POA: Diagnosis not present

## 2018-11-19 DIAGNOSIS — E559 Vitamin D deficiency, unspecified: Secondary | ICD-10-CM | POA: Diagnosis not present

## 2018-11-19 DIAGNOSIS — E7800 Pure hypercholesterolemia, unspecified: Secondary | ICD-10-CM | POA: Diagnosis not present

## 2018-11-19 DIAGNOSIS — R7309 Other abnormal glucose: Secondary | ICD-10-CM | POA: Diagnosis not present

## 2018-11-22 DIAGNOSIS — Z0000 Encounter for general adult medical examination without abnormal findings: Secondary | ICD-10-CM | POA: Diagnosis not present

## 2018-11-22 DIAGNOSIS — E7800 Pure hypercholesterolemia, unspecified: Secondary | ICD-10-CM | POA: Diagnosis not present

## 2018-11-22 DIAGNOSIS — R7309 Other abnormal glucose: Secondary | ICD-10-CM | POA: Diagnosis not present

## 2018-11-22 DIAGNOSIS — E559 Vitamin D deficiency, unspecified: Secondary | ICD-10-CM | POA: Diagnosis not present

## 2018-11-22 DIAGNOSIS — Z7189 Other specified counseling: Secondary | ICD-10-CM | POA: Diagnosis not present

## 2018-11-22 DIAGNOSIS — M797 Fibromyalgia: Secondary | ICD-10-CM | POA: Diagnosis not present

## 2018-11-22 DIAGNOSIS — L409 Psoriasis, unspecified: Secondary | ICD-10-CM | POA: Diagnosis not present

## 2018-11-23 DIAGNOSIS — M25562 Pain in left knee: Secondary | ICD-10-CM | POA: Diagnosis not present

## 2018-11-23 DIAGNOSIS — M25561 Pain in right knee: Secondary | ICD-10-CM | POA: Diagnosis not present

## 2018-11-25 DIAGNOSIS — R69 Illness, unspecified: Secondary | ICD-10-CM | POA: Diagnosis not present

## 2018-12-10 DIAGNOSIS — Z6826 Body mass index (BMI) 26.0-26.9, adult: Secondary | ICD-10-CM | POA: Diagnosis not present

## 2018-12-10 DIAGNOSIS — R21 Rash and other nonspecific skin eruption: Secondary | ICD-10-CM | POA: Diagnosis not present

## 2018-12-10 DIAGNOSIS — L401 Generalized pustular psoriasis: Secondary | ICD-10-CM | POA: Diagnosis not present

## 2018-12-10 DIAGNOSIS — E663 Overweight: Secondary | ICD-10-CM | POA: Diagnosis not present

## 2018-12-14 DIAGNOSIS — M25561 Pain in right knee: Secondary | ICD-10-CM | POA: Diagnosis not present

## 2018-12-14 DIAGNOSIS — M1711 Unilateral primary osteoarthritis, right knee: Secondary | ICD-10-CM | POA: Diagnosis not present

## 2019-01-29 ENCOUNTER — Other Ambulatory Visit: Payer: Self-pay | Admitting: Family Medicine

## 2019-01-29 ENCOUNTER — Ambulatory Visit
Admission: RE | Admit: 2019-01-29 | Discharge: 2019-01-29 | Disposition: A | Discharge status: Home / Self Care | Payer: Medicare HMO | Source: Ambulatory Visit | Attending: Family Medicine | Admitting: Family Medicine

## 2019-01-29 DIAGNOSIS — I4891 Unspecified atrial fibrillation: Secondary | ICD-10-CM

## 2019-01-29 DIAGNOSIS — R002 Palpitations: Secondary | ICD-10-CM | POA: Diagnosis not present

## 2019-02-01 ENCOUNTER — Other Ambulatory Visit: Payer: Self-pay

## 2019-02-01 ENCOUNTER — Ambulatory Visit: Payer: Medicare HMO | Admitting: Internal Medicine

## 2019-02-01 ENCOUNTER — Telehealth: Payer: Self-pay | Admitting: Radiology

## 2019-02-01 VITALS — BP 144/80 | HR 60 | Ht 70.0 in | Wt 185.0 lb

## 2019-02-01 DIAGNOSIS — R002 Palpitations: Secondary | ICD-10-CM | POA: Diagnosis not present

## 2019-02-01 MED ORDER — ASPIRIN EC 81 MG PO TBEC: 81.0000 mg | DELAYED_RELEASE_TABLET | Freq: Every day | ORAL | 3 refills | Status: DC

## 2019-02-01 NOTE — Patient Instructions (Addendum)
Medication Instructions:  NO CHANGES  *If you need a refill on your cardiac medications before your next appointment, please call your pharmacy*  Lab Work: NOT NEEDED   Testing/Procedures:  Your physician has recommended that you wear an event monitor- 30 DAYS. Event monitors are medical devices that record the heart's electrical activity. Doctors most often [place] these monitors to diagnose arrhythmias. Arrhythmias are problems with the speed or rhythm of the heartbeat. The monitor is a small, portable device. You can wear one while you do your normal daily activities. This is usually used to diagnose what is causing palpitations/syncope (passing out).   Follow-Up: At [facility], you and your health needs are our priority.  As part of our continuing mission to provide you with exceptional heart care, we have created designated Provider Care Teams.  These Care Teams include your primary Cardiologist (physician) and Advanced Practice Providers (APPs -  Physician Assistants and Nurse Practitioners) who all work together to provide you with the care you need, when you need it.  Your next appointment:   2 month(s)  The format for your next appointment:   Either In Person or Virtual  Provider:   Raliegh Ip Mali Hilty, MD  Other Instructions     [employer] Cardiac Event Monitor Instructions Your physician has requested you wear your cardiac event monitor for __30___ days, (1-30). [employer] may call or text to confirm a shipping address. The monitor will be sent to a land address via UPS. [employer] will not ship a monitor to a PO BOX. It typically takes 3-5 days to receive your monitor after it has been enrolled. [employer] will assist with USPS tracking if your package is delayed. The telephone number for [employer] is [phone]. Once you have received your monitor, please review the enclosed instructions. Instruction tutorials can also be viewed under help and settings on the enclosed  cell phone. Your monitor has already been registered assigning a specific monitor serial # to you.  Applying the monitor Remove cell phone from case and turn it on. The cell phone works as Dealer and needs to be within [employer] of you at all times. The cell phone will need to be charged on a daily basis. We recommend you plug the cell phone into the enclosed charger at your bedside table every night.  Monitor batteries: You will receive two monitor batteries labelled #1 and #2. These are your recorders. Plug battery #2 onto the second connection on the enclosed charger. Keep one battery on the charger at all times. This will keep the monitor battery deactivated. It will also keep it fully charged for when you need to switch your monitor batteries. A small light will be blinking on the battery emblem when it is charging. The light on the battery emblem will remain on when the battery is fully charged.  Open package of a Monitor strip. Insert battery #1 into black hood on strip and gently squeeze monitor battery onto connection as indicated in instruction booklet. Set aside while preparing skin.  Choose location for your strip, vertical or horizontal, as indicated in the instruction booklet. Shave to remove all hair from location. There cannot be any lotions, oils, powders, or colognes on skin where monitor is to be applied. Wipe skin clean with enclosed Saline wipe. Dry skin completely.  Peel paper labeled #1 off the back of the Monitor strip exposing the adhesive. Place the monitor on the chest in the vertical or horizontal position shown in the instruction booklet.  One arrow on the monitor strip must be pointing upward. Carefully remove paper labeled #2, attaching remainder of strip to your skin. Try not to create any folds or wrinkles in the strip as you apply it.  Firmly press and release the circle in the center of the monitor battery. You will hear a small beep. This  is turning the monitor battery on. The heart emblem on the monitor battery will light up every 5 seconds if the monitor battery in turned on and connected to the patient securely. Do not push and hold the circle down as this turns the monitor battery off. The cell phone will locate the monitor battery. A screen will appear on the cell phone checking the connection of your monitor strip. This may read poor connection initially but change to good connection within the next minute. Once your monitor accepts the connection you will hear a series of 3 beeps followed by a climbing crescendo of beeps. A screen will appear on the cell phone showing the two monitor strip placement options. Touch the picture that demonstrates where you applied the monitor strip.  Your monitor strip and battery are waterproof. You are able to shower, bathe, or swim with the monitor on. They just ask you do not submerge deeper than 3 feet underwater. We recommend removing the monitor if you are swimming in a lake, river, or ocean.  Your monitor battery will need to be switched to a fully charged monitor battery approximately once a week. The cell phone will alert you of an action which needs to be made.  On the cell phone, tap for details to reveal connection status, monitor battery status, and cell phone battery status. The green dots indicates your monitor is in good status. A red dot indicates there is something that needs your attention.  To record a symptom, click the circle on the monitor battery. In 30-60 seconds a list of symptoms will appear on the cell phone. Select your symptom and tap save. Your monitor will record a sustained or significant arrhythmia regardless of you clicking the button. Some patients do not feel the heart rhythm irregularities. [employer] will notify us of any serious or critical events.  Refer to instruction booklet for instructions on switching batteries, changing strips, the Do not  disturb or Pause features, or any additional questions.  Call [employer] at [phone], to confirm your monitor is transmitting and record your baseline. They will answer any questions you may have regarding the monitor instructions at that time.  Returning the monitor to [place] all equipment back into blue box. Peel off strip of paper to expose adhesive and close box securely. There is a prepaid UPS shipping label on this box. Drop in a UPS drop box, or at a UPS facility like [employer]. You may also contact [employer] to arrange UPS to pick up monitor package at your home   .

## 2019-02-01 NOTE — Telephone Encounter (Signed)
Enrolled patient for a 30 day [employer] Event monitor to be mailed to patients home.  

## 2019-02-03 ENCOUNTER — Encounter: Payer: Self-pay | Admitting: Internal Medicine

## 2019-02-03 NOTE — Progress Notes (Signed)
OFFICE NOTE  Chief Complaint:  [name]  Primary Care Physician: Mayra Neer, MD  HPI:  [name] is a 68 y.o. female with a past medial history significant for fibromyalgia, hyperlipidemia, GERD and prediabetes, who presents with recent onset palpitations.  She had recently purchased an apple watch and noted some irregularity on the device.  Subsequently saw her primary care provider.  And EKG was performed which was consistent with atrial fibrillation.  I reviewed the EKG personally.  It does appear to be there is an abnormal baseline which almost looks artifactual in the inferior leads.  Of the lateral and septal leads however show clear P waves associated with a QRS with regular RR intervals, suggesting that the underlying rhythm is likely a sinus rhythm with PACs and baseline artifact.  She was referred for new onset A. fib however I am suspected that.  The apple watch sometimes will indicate atrial fibrillation with frequent PACs.  This has to do with the frequency of irregularity of the RR interval.  Besides the findings on EKG and her watch settings, she says that she has very infrequent if any palpitations.  Her EKG today is sinus rhythm at 60.  PMHx:  Past Medical History:  Diagnosis Date   Fibromyalgia muscle pain 2007   Hyperlipidemia 2012   Menopausal state 08/11/2007   On Prometrium qomonth   Shingles 2008   Right side of face & neck tx. w/ prednisone    Past Surgical History:  Procedure Laterality Date   CERVICAL BIOPSY  W/ LOOP ELECTRODE EXCISION  07/22/1999   squamous dysplasia, margins clear; Dr. Juleen China   COLPOSCOPY W/ BIOPSY / CURETTAGE  07/05/1999   CIN II, ECC benign; DR. Juleen China   KNEE ARTHROSCOPY     torn ligaments, right   LUMBAR DISC ARTHROPLASTY  01/2007   fusion L4-L5   LUMBAR DISC SURGERY  12/1998   L5-S1   LUMBAR DISC SURGERY  02/2014   bone spurs and fusion of F5   TUBAL LIGATION  1982    FAMHx:  Family History  Problem Relation  Age of Onset   Diabetes Father    Hypertension Father    Hyperlipidemia Father    Diabetes Paternal Grandmother    Heart failure Paternal Grandmother    Cancer Mother        thymus cancer 2003   Stroke Maternal Grandmother    Colon polyps Maternal Grandmother    Colon cancer Neg Hx    Pancreatic cancer Neg Hx    Stomach cancer Neg Hx    Rectal cancer Neg Hx     SOCHx:   reports that she has never smoked. She has never used smokeless tobacco. She reports that she does not drink alcohol or use drugs.  ALLERGIES:  No Known Allergies  ROS: Pertinent items noted in HPI and remainder of comprehensive ROS otherwise negative.  HOME MEDS: Current Outpatient Medications on File Prior to Visit  Medication Sig Dispense Refill   calcium-vitamin D (OSCAL WITH D) 500-200 MG-UNIT per tablet Take 1 tablet by mouth daily.     cholecalciferol (VITAMIN D) 1000 UNITS tablet Take 1,000 Units by mouth daily.     famotidine (PEPCID) 40 MG tablet Take 1 tablet by mouth 2 (two) times daily.     fish oil-omega-3 fatty acids 1000 MG capsule Take 2 g by mouth daily.     hydrOXYzine (ATARAX/VISTARIL) 25 MG tablet Take 2 tablets in the morning and 2 tablets at  bedtime     levocetirizine (XYZAL) 5 MG tablet Take 1 tablet by mouth 2 (two) times daily.     pravastatin (PRAVACHOL) 40 MG tablet Take 1 tablet by mouth daily.     triamcinolone cream (KENALOG) 0.1 % triamcinolone acetonide 0.1 % topical cream     No current facility-administered medications on file prior to visit.     LABS/IMAGING: No results found for this or any previous visit (from the past 48 hour(s)). No results found.  LIPID PANEL: No results found for: CHOL, TRIG, HDL, CHOLHDL, VLDL, LDLCALC, LDLDIRECT   WEIGHTS: Wt Readings from Last 3 Encounters:  02/01/19 185 lb (83.9 kg)  08/22/16 178 lb (80.7 kg)  08/19/15 183 lb 12.8 oz (83.4 kg)    VITALS: BP (!) 144/80    Pulse 60    Ht 5\' 10"  (1.778 m)    Wt 185 lb  (83.9 kg)    LMP 07/30/2007 (Approximate)    BMI 26.54 kg/m   EXAM: General appearance: alert and no distress Neck: no carotid bruit, no JVD and thyroid not enlarged, symmetric, no tenderness/mass/nodules Lungs: clear to auscultation bilaterally Heart: regular rate and rhythm, S1, S2 normal, no murmur, click, rub or gallop Abdomen: soft, non-tender; bowel sounds normal; no masses,  no organomegaly Extremities: extremities normal, atraumatic, no cyanosis or edema Pulses: 2+ and symmetric Skin: Skin color, texture, turgor normal. No rashes or lesions Neurologic: Grossly normal Psych: Pleasant  EKG: Sinus rhythm at 60- personally reviewed  ASSESSMENT: 1. Palpitations,?  A. fib 2. CHA2DS2-VASc score of 2  PLAN: 1.   [name] may have new onset A. fib however my review of her EKG appears to be artifact with PACs.  The computer however thought it was atrial fibrillation.  Her EKG today is clearly sinus rhythm and her sinus beats look very similar to the beats seen on that EKG if 1 takes away the artifact seen in the inferior leads.  Hopefully this is the case however at this point, I would like to place a 30-day monitor to see if she has any evidence of atrial fibrillation.  Her chads vas score is actually 2 based on age over 72 and female.  Based on this, if she were to have A. fib likely I would recommend Eliquis, but for now she is on low-dose aspirin which I think is reasonable since I am not 100% convinced she is actually had A. Fib.  We will plan follow-up in 4 to 6 weeks after her monitor.  Thanks again for the kind referral.  Sarah Casino, MD, FACC, [place] Director of the Advanced Lipid Disorders &  Cardiovascular Risk Reduction Clinic Diplomate of the American Board of Clinical Lipidology Attending Cardiologist  Direct Dial: [phone]   Fax: [phone]  Website:  [name] 02/03/2019, 3:45 PM

## 2019-02-06 ENCOUNTER — Encounter (INDEPENDENT_AMBULATORY_CARE_PROVIDER_SITE_OTHER): Payer: Medicare HMO

## 2019-02-06 DIAGNOSIS — R002 Palpitations: Secondary | ICD-10-CM

## 2019-03-26 DIAGNOSIS — R69 Illness, unspecified: Secondary | ICD-10-CM | POA: Diagnosis not present

## 2019-04-05 ENCOUNTER — Ambulatory Visit: Payer: Medicare HMO | Admitting: Internal Medicine

## 2019-04-05 ENCOUNTER — Other Ambulatory Visit: Payer: Self-pay

## 2019-04-05 ENCOUNTER — Encounter: Payer: Self-pay | Admitting: Internal Medicine

## 2019-04-05 VITALS — BP 112/69 | HR 74 | Ht 70.0 in | Wt 192.2 lb

## 2019-04-05 DIAGNOSIS — R002 Palpitations: Secondary | ICD-10-CM

## 2019-04-05 NOTE — Progress Notes (Signed)
OFFICE NOTE  Chief Complaint:  Follow-up monitor  Primary Care Physician: Mayra Neer, MD  HPI:  [name] is a 69 y.o. female with a past medial history significant for fibromyalgia, hyperlipidemia, GERD and prediabetes, who presents with recent onset palpitations.  She had recently purchased an apple watch and noted some irregularity on the device.  Subsequently saw her primary care provider.  And EKG was performed which was consistent with atrial fibrillation.  I reviewed the EKG personally.  It does appear to be there is an abnormal baseline which almost looks artifactual in the inferior leads.  Of the lateral and septal leads however show clear P waves associated with a QRS with regular RR intervals, suggesting that the underlying rhythm is likely a sinus rhythm with PACs and baseline artifact.  She was referred for new onset A. fib however I am suspected that.  The apple watch sometimes will indicate atrial fibrillation with frequent PACs.  This has to do with the frequency of irregularity of the RR interval.  Besides the findings on EKG and her watch settings, she says that she has very infrequent if any palpitations.  Her EKG today is sinus rhythm at 60.  04/05/2019  [name] returns today for follow-up of her monitor.  She wore this for the better part of 3 weeks due to difficulty with the adhesive which caused some skin irritation.  Fortunately the monitor showed sinus rhythm with only less than 2% PACs and some PVCs.  I do feel that her initial EKG did not represent A. fib rather extrasystoles.  PMHx:  Past Medical History:  Diagnosis Date  . Fibromyalgia muscle pain 2007  . Hyperlipidemia 2012  . Menopausal state 08/11/2007   On Prometrium qomonth  . Shingles 2008   Right side of face & neck tx. w/ prednisone    Past Surgical History:  Procedure Laterality Date  . CERVICAL BIOPSY  W/ LOOP ELECTRODE EXCISION  07/22/1999   squamous dysplasia, margins clear; Dr.  Juleen China  . COLPOSCOPY W/ BIOPSY / CURETTAGE  07/05/1999   CIN II, ECC benign; DR. LKS  . KNEE ARTHROSCOPY     torn ligaments, right  . LUMBAR DISC ARTHROPLASTY  01/2007   fusion L4-L5  . LUMBAR DISC SURGERY  12/1998   L5-S1  . LUMBAR DISC SURGERY  02/2014   bone spurs and fusion of F5  . TUBAL LIGATION  1982    FAMHx:  Family History  Problem Relation Age of Onset  . Diabetes Father   . Hypertension Father   . Hyperlipidemia Father   . Diabetes Paternal Grandmother   . Heart failure Paternal Grandmother   . Cancer Mother        thymus cancer 2003  . Stroke Maternal Grandmother   . Colon polyps Maternal Grandmother   . Colon cancer Neg Hx   . Pancreatic cancer Neg Hx   . Stomach cancer Neg Hx   . Rectal cancer Neg Hx     SOCHx:   reports that she has never smoked. She has never used smokeless tobacco. She reports that she does not drink alcohol or use drugs.  ALLERGIES:  No Known Allergies  ROS: Pertinent items noted in HPI and remainder of comprehensive ROS otherwise negative.  HOME MEDS: Current Outpatient Medications on File Prior to Visit  Medication Sig Dispense Refill  . aspirin EC 81 MG tablet Take 1 tablet (81 mg total) by mouth daily. 90 tablet 3  . calcium-vitamin D (  OSCAL WITH D) 500-200 MG-UNIT per tablet Take 1 tablet by mouth daily.    . fish oil-omega-3 fatty acids 1000 MG capsule Take 2 g by mouth daily.    . hydrOXYzine (ATARAX/VISTARIL) 25 MG tablet Take 2 tablets in the morning and 2 tablets at bedtime    . levocetirizine (XYZAL) 5 MG tablet Take 1 tablet by mouth 2 (two) times daily.    . pravastatin (PRAVACHOL) 40 MG tablet Take 1 tablet by mouth daily.    Marland Kitchen triamcinolone cream (KENALOG) 0.1 % triamcinolone acetonide 0.1 % topical cream     No current facility-administered medications on file prior to visit.    LABS/IMAGING: No results found for this or any previous visit (from the past 48 hour(s)). No results found.  LIPID PANEL: No results  found for: CHOL, TRIG, HDL, CHOLHDL, VLDL, LDLCALC, LDLDIRECT   WEIGHTS: Wt Readings from Last 3 Encounters:  04/05/19 192 lb 3.2 oz (87.2 kg)  02/01/19 185 lb (83.9 kg)  08/22/16 178 lb (80.7 kg)    VITALS: BP 112/69   Pulse 74   Ht 5\' 10"  (1.778 m)   Wt 192 lb 3.2 oz (87.2 kg)   LMP 07/30/2007 (Approximate)   SpO2 94%   BMI 27.58 kg/m   EXAM: Deferred  EKG: Deferred  ASSESSMENT: 1. Palpitations - PAC's/PVC's, no afib  PLAN: 1.   [name] had no evidence of A. fib on her monitoring, only isolated PACs and PVCs.  She is asymptomatic.  At this point I would not recommend anticoagulation and she could discontinue her aspirin.  She does have an apple watch and could periodically monitor for possible A. fib however I think it is unlikely that is what she has been having.  It may have been her steroids that increased palpitations.  Follow-up with me as needed.  Pixie Casino, MD, [facility], [place] Director of the Advanced Lipid Disorders &  Cardiovascular Risk Reduction Clinic Diplomate of the American Board of Clinical Lipidology Attending Cardiologist  Direct Dial: [phone]  Fax: [phone]  Website:  [email or web address]   Nadean Corwin Andrius Andrepont 04/05/2019, 1:40 PM

## 2019-04-05 NOTE — Patient Instructions (Signed)
Medication Instructions:  Your physician recommends that you continue on your current medications as directed. Please refer to the Current Medication list given to you today.  *If you need a refill on your cardiac medications before your next appointment, please call your pharmacy*   Follow-Up: At [facility], you and your health needs are our priority.  As part of our continuing mission to provide you with exceptional heart care, we have created designated Provider Care Teams.  These Care Teams include your primary Cardiologist (physician) and Advanced Practice Providers (APPs -  Physician Assistants and Nurse Practitioners) who all work together to provide you with the care you need, when you need it.  Your next appointment:   As needed with Dr. Debara Pickett

## 2019-04-16 DIAGNOSIS — L309 Dermatitis, unspecified: Secondary | ICD-10-CM | POA: Diagnosis not present

## 2019-04-16 DIAGNOSIS — L578 Other skin changes due to chronic exposure to nonionizing radiation: Secondary | ICD-10-CM | POA: Diagnosis not present

## 2019-04-16 DIAGNOSIS — Z808 Family history of malignant neoplasm of other organs or systems: Secondary | ICD-10-CM | POA: Diagnosis not present

## 2019-04-16 DIAGNOSIS — L219 Seborrheic dermatitis, unspecified: Secondary | ICD-10-CM | POA: Diagnosis not present

## 2019-04-16 DIAGNOSIS — D1801 Hemangioma of skin and subcutaneous tissue: Secondary | ICD-10-CM | POA: Diagnosis not present

## 2019-04-16 DIAGNOSIS — L821 Other seborrheic keratosis: Secondary | ICD-10-CM | POA: Diagnosis not present

## 2019-04-16 DIAGNOSIS — L570 Actinic keratosis: Secondary | ICD-10-CM | POA: Diagnosis not present

## 2019-05-14 DIAGNOSIS — L249 Irritant contact dermatitis, unspecified cause: Secondary | ICD-10-CM | POA: Diagnosis not present

## 2019-06-04 DIAGNOSIS — L249 Irritant contact dermatitis, unspecified cause: Secondary | ICD-10-CM | POA: Diagnosis not present

## 2019-06-20 DIAGNOSIS — L209 Atopic dermatitis, unspecified: Secondary | ICD-10-CM | POA: Diagnosis not present

## 2019-06-20 DIAGNOSIS — L249 Irritant contact dermatitis, unspecified cause: Secondary | ICD-10-CM | POA: Diagnosis not present

## 2019-06-27 DIAGNOSIS — L308 Other specified dermatitis: Secondary | ICD-10-CM | POA: Diagnosis not present

## 2019-06-27 DIAGNOSIS — L309 Dermatitis, unspecified: Secondary | ICD-10-CM | POA: Diagnosis not present

## 2019-07-18 DIAGNOSIS — L309 Dermatitis, unspecified: Secondary | ICD-10-CM | POA: Diagnosis not present

## 2019-10-08 ENCOUNTER — Other Ambulatory Visit: Payer: Self-pay | Admitting: Family Medicine

## 2019-10-08 DIAGNOSIS — Z1231 Encounter for screening mammogram for malignant neoplasm of breast: Secondary | ICD-10-CM

## 2019-10-15 ENCOUNTER — Ambulatory Visit
Admission: RE | Admit: 2019-10-15 | Discharge: 2019-10-15 | Disposition: A | Discharge status: Home / Self Care | Payer: Medicare HMO | Source: Ambulatory Visit | Attending: Family Medicine | Admitting: Family Medicine

## 2019-10-15 ENCOUNTER — Other Ambulatory Visit: Payer: Self-pay

## 2019-10-15 DIAGNOSIS — Z1231 Encounter for screening mammogram for malignant neoplasm of breast: Secondary | ICD-10-CM

## 2019-11-29 DIAGNOSIS — R69 Illness, unspecified: Secondary | ICD-10-CM | POA: Diagnosis not present

## 2019-12-04 DIAGNOSIS — L409 Psoriasis, unspecified: Secondary | ICD-10-CM | POA: Diagnosis not present

## 2019-12-04 DIAGNOSIS — E7800 Pure hypercholesterolemia, unspecified: Secondary | ICD-10-CM | POA: Diagnosis not present

## 2019-12-04 DIAGNOSIS — D692 Other nonthrombocytopenic purpura: Secondary | ICD-10-CM | POA: Diagnosis not present

## 2019-12-04 DIAGNOSIS — E559 Vitamin D deficiency, unspecified: Secondary | ICD-10-CM | POA: Diagnosis not present

## 2019-12-04 DIAGNOSIS — M8580 Other specified disorders of bone density and structure, unspecified site: Secondary | ICD-10-CM | POA: Diagnosis not present

## 2019-12-04 DIAGNOSIS — Z7189 Other specified counseling: Secondary | ICD-10-CM | POA: Diagnosis not present

## 2019-12-04 DIAGNOSIS — Z23 Encounter for immunization: Secondary | ICD-10-CM | POA: Diagnosis not present

## 2019-12-04 DIAGNOSIS — M797 Fibromyalgia: Secondary | ICD-10-CM | POA: Diagnosis not present

## 2019-12-04 DIAGNOSIS — Z0000 Encounter for general adult medical examination without abnormal findings: Secondary | ICD-10-CM | POA: Diagnosis not present

## 2019-12-04 DIAGNOSIS — R7309 Other abnormal glucose: Secondary | ICD-10-CM | POA: Diagnosis not present

## 2019-12-10 ENCOUNTER — Other Ambulatory Visit: Payer: Self-pay | Admitting: Family Medicine

## 2019-12-10 DIAGNOSIS — M8580 Other specified disorders of bone density and structure, unspecified site: Secondary | ICD-10-CM

## 2019-12-24 DIAGNOSIS — M1711 Unilateral primary osteoarthritis, right knee: Secondary | ICD-10-CM | POA: Diagnosis not present

## 2020-01-29 DIAGNOSIS — M5416 Radiculopathy, lumbar region: Secondary | ICD-10-CM | POA: Diagnosis not present

## 2020-02-10 IMAGING — MG DIGITAL SCREENING BILATERAL MAMMOGRAM WITH TOMO AND CAD
8 series · 8 of 24 positions shown · non-contrast
Comparison: Previous exam(s).

CLINICAL DATA: Screening.

EXAM:
DIGITAL SCREENING BILATERAL MAMMOGRAM WITH TOMO AND CAD

[L CC synth-2D]
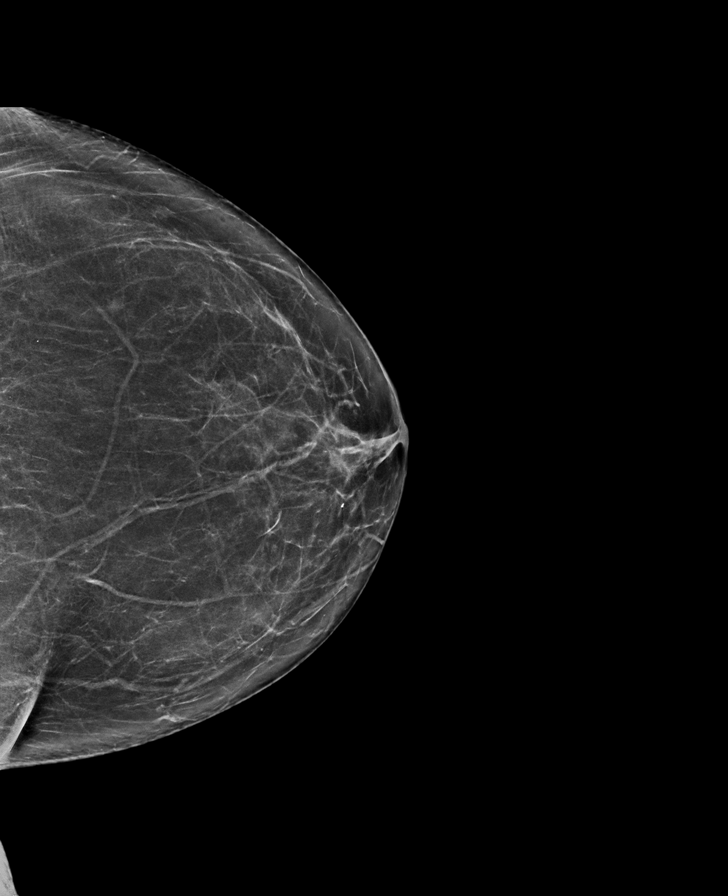

[L MLO synth-2D]
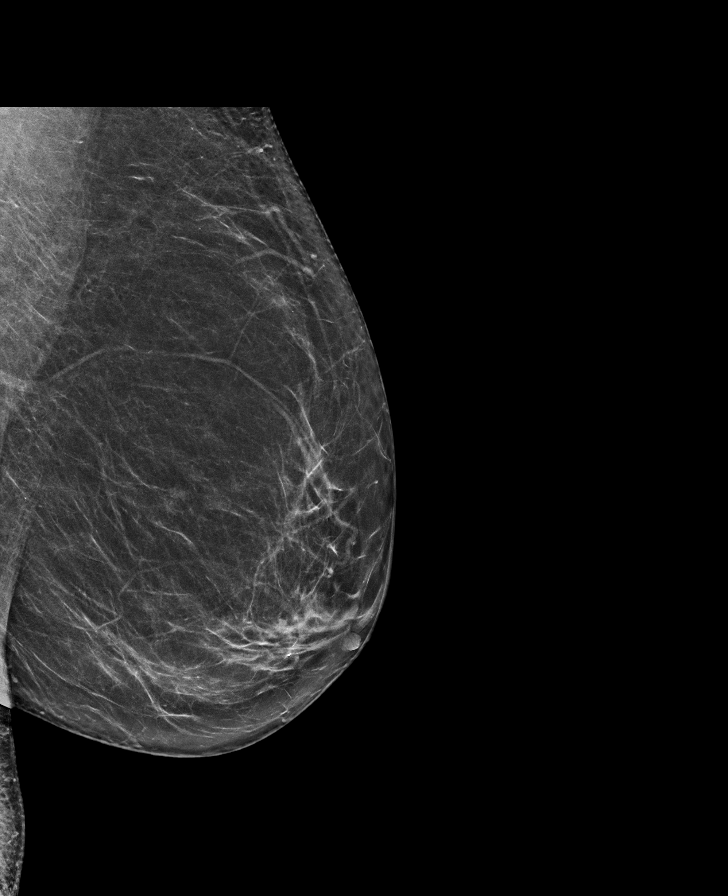

[R CC synth-2D]
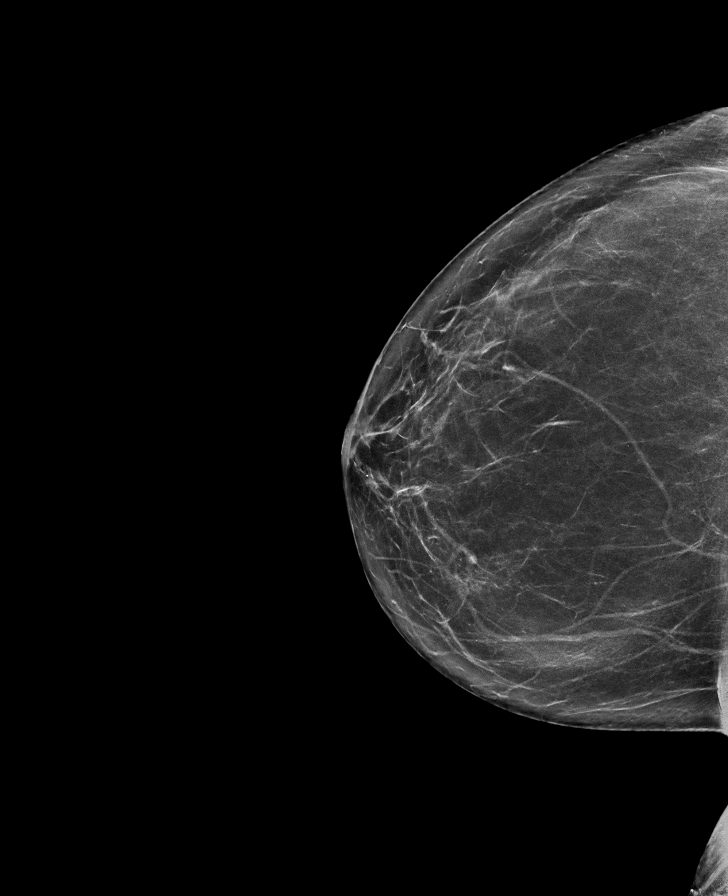

[R MLO synth-2D]
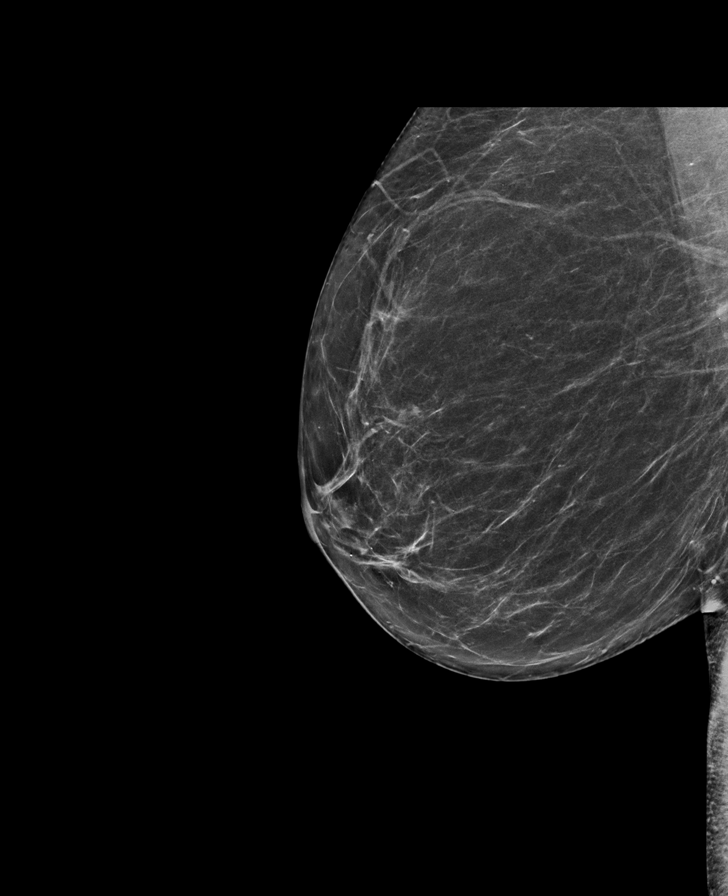

[R CC tomo · tomo slice 37/73.0]
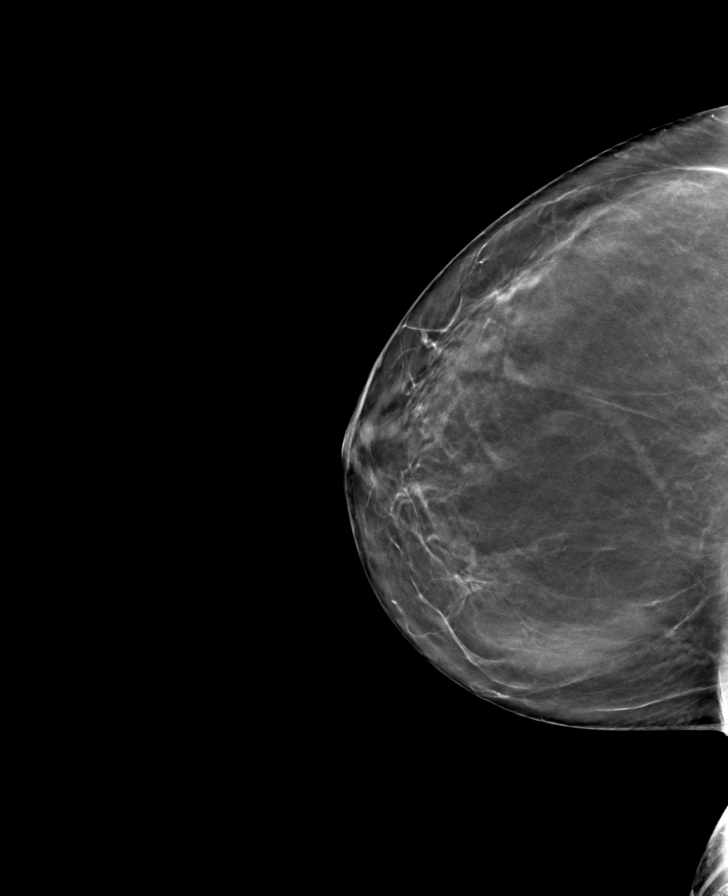

[L CC tomo · tomo slice 35/69.0]
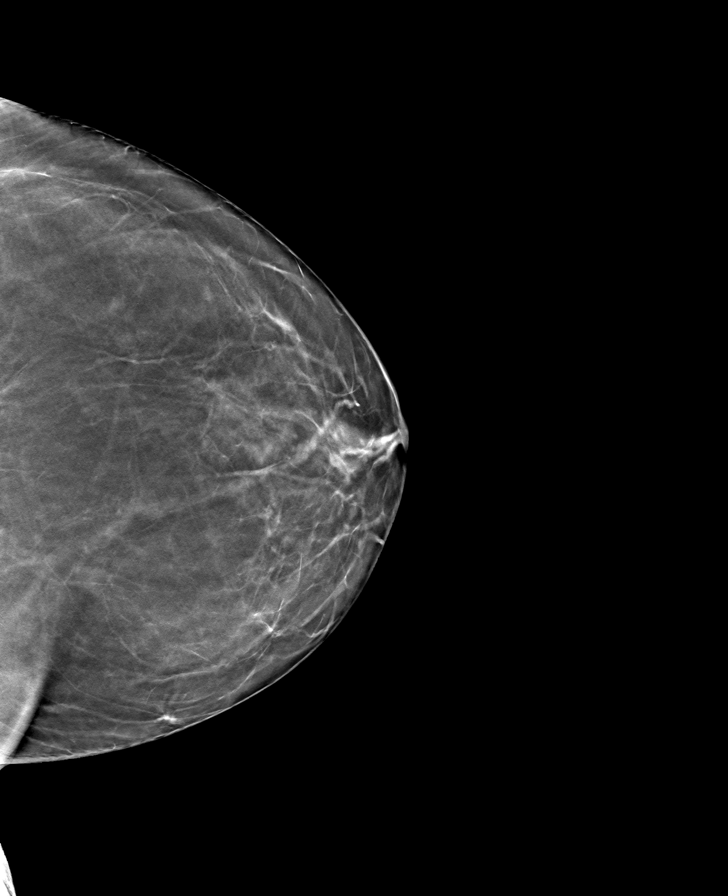

[L MLO tomo · tomo slice 37/72.0]
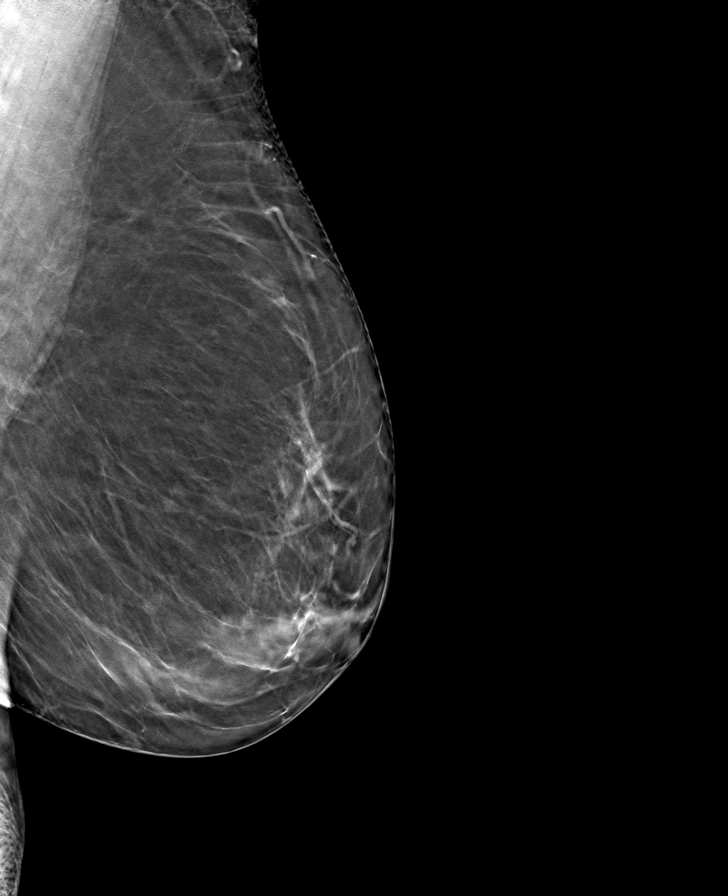

[R MLO tomo · tomo slice 37/72.0]
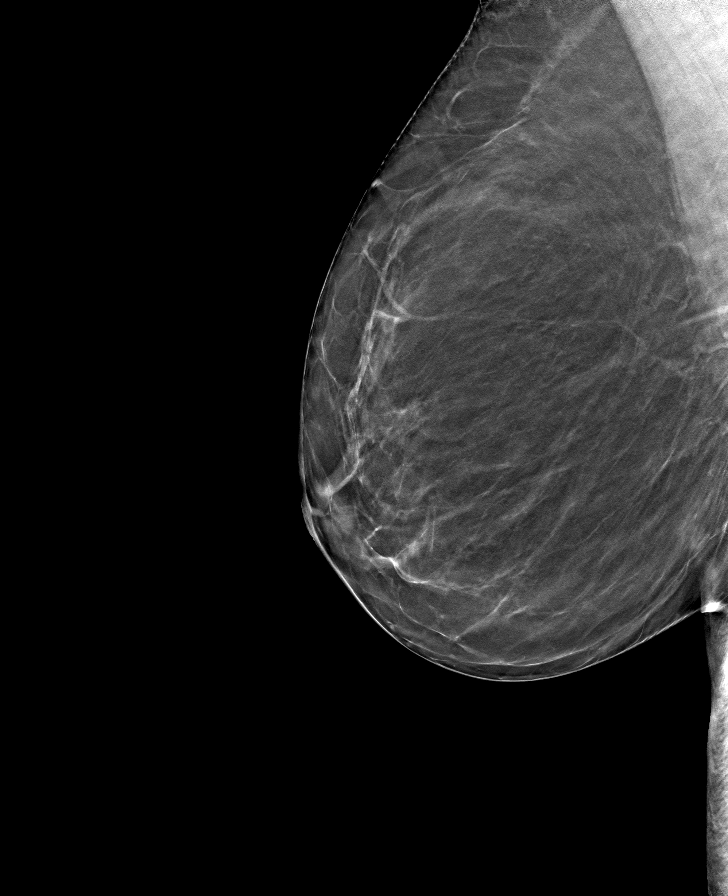

[8 of 24 positions shown; findings below may reference images not displayed]

ACR Breast Density Category b: There are scattered areas of
fibroglandular density.
FINDINGS: There are no findings suspicious for malignancy. Images were
processed with CAD.
IMPRESSION: No mammographic evidence of malignancy. A result letter of this
screening mammogram will be mailed directly to the patient.

RECOMMENDATION:
Screening mammogram in one year. (Code:[id number])

BI-RADS CATEGORY  1: Negative.

## 2020-02-25 DIAGNOSIS — H2513 Age-related nuclear cataract, bilateral: Secondary | ICD-10-CM | POA: Diagnosis not present

## 2020-02-25 DIAGNOSIS — Z0100 Encounter for examination of eyes and vision without abnormal findings: Secondary | ICD-10-CM | POA: Diagnosis not present

## 2020-03-13 ENCOUNTER — Other Ambulatory Visit: Payer: Medicare HMO

## 2020-05-07 DIAGNOSIS — M7712 Lateral epicondylitis, left elbow: Secondary | ICD-10-CM | POA: Diagnosis not present

## 2020-05-12 DIAGNOSIS — Z79899 Other long term (current) drug therapy: Secondary | ICD-10-CM | POA: Diagnosis not present

## 2020-05-12 DIAGNOSIS — L309 Dermatitis, unspecified: Secondary | ICD-10-CM | POA: Diagnosis not present

## 2020-06-09 DIAGNOSIS — J069 Acute upper respiratory infection, unspecified: Secondary | ICD-10-CM | POA: Diagnosis not present

## 2020-06-09 DIAGNOSIS — R059 Cough, unspecified: Secondary | ICD-10-CM | POA: Diagnosis not present

## 2020-06-09 DIAGNOSIS — J018 Other acute sinusitis: Secondary | ICD-10-CM | POA: Diagnosis not present

## 2020-07-01 ENCOUNTER — Other Ambulatory Visit: Payer: Self-pay

## 2020-08-19 ENCOUNTER — Other Ambulatory Visit: Payer: Self-pay

## 2020-08-19 ENCOUNTER — Emergency Department (HOSPITAL_BASED_OUTPATIENT_CLINIC_OR_DEPARTMENT_OTHER)
Admission: EM | Admit: 2020-08-19 | Discharge: 2020-08-19 | Disposition: A | Discharge status: Home / Self Care | Payer: Medicare HMO | Attending: Emergency Medicine | Admitting: Emergency Medicine

## 2020-08-19 ENCOUNTER — Encounter (HOSPITAL_BASED_OUTPATIENT_CLINIC_OR_DEPARTMENT_OTHER): Payer: Self-pay | Admitting: Obstetrics and Gynecology

## 2020-08-19 DIAGNOSIS — Z5321 Procedure and treatment not carried out due to patient leaving prior to being seen by health care provider: Secondary | ICD-10-CM | POA: Insufficient documentation

## 2020-08-19 DIAGNOSIS — S01511A Laceration without foreign body of lip, initial encounter: Secondary | ICD-10-CM | POA: Insufficient documentation

## 2020-08-19 DIAGNOSIS — S81811A Laceration without foreign body, right lower leg, initial encounter: Secondary | ICD-10-CM | POA: Diagnosis not present

## 2020-08-19 DIAGNOSIS — W19XXXA Unspecified fall, initial encounter: Secondary | ICD-10-CM | POA: Diagnosis not present

## 2020-08-19 DIAGNOSIS — K089 Disorder of teeth and supporting structures, unspecified: Secondary | ICD-10-CM | POA: Insufficient documentation

## 2020-08-19 DIAGNOSIS — S00501A Unspecified superficial injury of lip, initial encounter: Secondary | ICD-10-CM | POA: Diagnosis not present

## 2020-08-19 NOTE — ED Notes (Signed)
  Patient notified front desk that she was leaving.

## 2020-08-19 NOTE — ED Triage Notes (Signed)
Patient reports to the ER for laceration to lip and right leg after a mechanical fall. Patient reports her teeth shifted, but no broken teeth.  Patient is unsure of Tdap.  Patient has a 2.5 inch approximate laceration to right shin.

## 2020-08-20 DIAGNOSIS — S81811A Laceration without foreign body, right lower leg, initial encounter: Secondary | ICD-10-CM | POA: Diagnosis not present

## 2020-08-26 DIAGNOSIS — R32 Unspecified urinary incontinence: Secondary | ICD-10-CM | POA: Diagnosis not present

## 2020-08-26 DIAGNOSIS — R439 Unspecified disturbances of smell and taste: Secondary | ICD-10-CM | POA: Diagnosis not present

## 2020-08-26 DIAGNOSIS — G629 Polyneuropathy, unspecified: Secondary | ICD-10-CM | POA: Diagnosis not present

## 2020-08-26 DIAGNOSIS — R109 Unspecified abdominal pain: Secondary | ICD-10-CM | POA: Diagnosis not present

## 2020-08-26 DIAGNOSIS — S81819A Laceration without foreign body, unspecified lower leg, initial encounter: Secondary | ICD-10-CM | POA: Diagnosis not present

## 2020-09-01 ENCOUNTER — Other Ambulatory Visit: Payer: Self-pay | Admitting: Family Medicine

## 2020-09-01 DIAGNOSIS — R109 Unspecified abdominal pain: Secondary | ICD-10-CM

## 2020-09-11 ENCOUNTER — Ambulatory Visit
Admission: RE | Admit: 2020-09-11 | Discharge: 2020-09-11 | Disposition: A | Discharge status: Home / Self Care | Payer: Medicare HMO | Source: Ambulatory Visit | Attending: Family Medicine | Admitting: Family Medicine

## 2020-09-11 DIAGNOSIS — R109 Unspecified abdominal pain: Secondary | ICD-10-CM

## 2020-09-11 DIAGNOSIS — M5137 Other intervertebral disc degeneration, lumbosacral region: Secondary | ICD-10-CM | POA: Diagnosis not present

## 2020-09-11 DIAGNOSIS — N200 Calculus of kidney: Secondary | ICD-10-CM | POA: Diagnosis not present

## 2020-09-11 DIAGNOSIS — M4807 Spinal stenosis, lumbosacral region: Secondary | ICD-10-CM | POA: Diagnosis not present

## 2020-09-11 DIAGNOSIS — M5136 Other intervertebral disc degeneration, lumbar region: Secondary | ICD-10-CM | POA: Diagnosis not present

## 2020-09-11 MED ORDER — IOPAMIDOL (ISOVUE-300) INJECTION 61%
100.0000 mL | Freq: Once | INTRAVENOUS | Status: AC | PRN
  Administered 2020-09-11: 100 mL via INTRAVENOUS

## 2020-11-19 ENCOUNTER — Other Ambulatory Visit: Payer: Self-pay | Admitting: Family Medicine

## 2020-11-19 ENCOUNTER — Other Ambulatory Visit: Payer: Self-pay

## 2020-11-19 DIAGNOSIS — M8580 Other specified disorders of bone density and structure, unspecified site: Secondary | ICD-10-CM

## 2020-11-19 DIAGNOSIS — Z1231 Encounter for screening mammogram for malignant neoplasm of breast: Secondary | ICD-10-CM

## 2020-12-09 ENCOUNTER — Other Ambulatory Visit: Payer: Self-pay

## 2020-12-14 ENCOUNTER — Other Ambulatory Visit: Payer: Self-pay | Admitting: Family Medicine

## 2020-12-14 DIAGNOSIS — Z1231 Encounter for screening mammogram for malignant neoplasm of breast: Secondary | ICD-10-CM

## 2020-12-15 DIAGNOSIS — Z23 Encounter for immunization: Secondary | ICD-10-CM | POA: Diagnosis not present

## 2020-12-15 DIAGNOSIS — D692 Other nonthrombocytopenic purpura: Secondary | ICD-10-CM | POA: Diagnosis not present

## 2020-12-15 DIAGNOSIS — M797 Fibromyalgia: Secondary | ICD-10-CM | POA: Diagnosis not present

## 2020-12-15 DIAGNOSIS — M8580 Other specified disorders of bone density and structure, unspecified site: Secondary | ICD-10-CM | POA: Diagnosis not present

## 2020-12-15 DIAGNOSIS — E7800 Pure hypercholesterolemia, unspecified: Secondary | ICD-10-CM | POA: Diagnosis not present

## 2020-12-15 DIAGNOSIS — L409 Psoriasis, unspecified: Secondary | ICD-10-CM | POA: Diagnosis not present

## 2020-12-15 DIAGNOSIS — M179 Osteoarthritis of knee, unspecified: Secondary | ICD-10-CM | POA: Diagnosis not present

## 2020-12-15 DIAGNOSIS — Z0000 Encounter for general adult medical examination without abnormal findings: Secondary | ICD-10-CM | POA: Diagnosis not present

## 2020-12-15 DIAGNOSIS — R7303 Prediabetes: Secondary | ICD-10-CM | POA: Diagnosis not present

## 2021-01-07 ENCOUNTER — Ambulatory Visit: Payer: Medicare HMO

## 2021-01-12 DIAGNOSIS — Z1231 Encounter for screening mammogram for malignant neoplasm of breast: Secondary | ICD-10-CM

## 2021-01-14 DIAGNOSIS — L209 Atopic dermatitis, unspecified: Secondary | ICD-10-CM | POA: Diagnosis not present

## 2021-01-14 DIAGNOSIS — Z23 Encounter for immunization: Secondary | ICD-10-CM | POA: Diagnosis not present

## 2021-02-04 DIAGNOSIS — R030 Elevated blood-pressure reading, without diagnosis of hypertension: Secondary | ICD-10-CM | POA: Diagnosis not present

## 2021-02-04 DIAGNOSIS — Z9889 Other specified postprocedural states: Secondary | ICD-10-CM | POA: Diagnosis not present

## 2021-02-04 DIAGNOSIS — M545 Low back pain, unspecified: Secondary | ICD-10-CM | POA: Diagnosis not present

## 2021-02-04 DIAGNOSIS — M5416 Radiculopathy, lumbar region: Secondary | ICD-10-CM | POA: Diagnosis not present

## 2021-02-04 DIAGNOSIS — Z6827 Body mass index (BMI) 27.0-27.9, adult: Secondary | ICD-10-CM | POA: Diagnosis not present

## 2021-02-16 DIAGNOSIS — M545 Low back pain, unspecified: Secondary | ICD-10-CM | POA: Diagnosis not present

## 2021-02-16 DIAGNOSIS — M2578 Osteophyte, vertebrae: Secondary | ICD-10-CM | POA: Diagnosis not present

## 2021-02-16 DIAGNOSIS — M48061 Spinal stenosis, lumbar region without neurogenic claudication: Secondary | ICD-10-CM | POA: Diagnosis not present

## 2021-02-16 DIAGNOSIS — M5416 Radiculopathy, lumbar region: Secondary | ICD-10-CM | POA: Diagnosis not present

## 2021-02-26 DIAGNOSIS — H2513 Age-related nuclear cataract, bilateral: Secondary | ICD-10-CM | POA: Diagnosis not present

## 2021-03-03 DIAGNOSIS — M5416 Radiculopathy, lumbar region: Secondary | ICD-10-CM | POA: Diagnosis not present

## 2021-03-09 ENCOUNTER — Other Ambulatory Visit: Payer: Self-pay

## 2021-03-09 ENCOUNTER — Ambulatory Visit
Admission: RE | Admit: 2021-03-09 | Discharge: 2021-03-09 | Disposition: A | Discharge status: Home / Self Care | Payer: Medicare HMO | Source: Ambulatory Visit | Attending: Family Medicine | Admitting: Family Medicine

## 2021-03-09 DIAGNOSIS — Z1231 Encounter for screening mammogram for malignant neoplasm of breast: Secondary | ICD-10-CM

## 2021-04-30 ENCOUNTER — Other Ambulatory Visit: Payer: Medicare HMO

## 2021-05-21 DIAGNOSIS — L209 Atopic dermatitis, unspecified: Secondary | ICD-10-CM | POA: Diagnosis not present

## 2021-05-21 DIAGNOSIS — Z23 Encounter for immunization: Secondary | ICD-10-CM | POA: Diagnosis not present

## 2021-06-29 DIAGNOSIS — M797 Fibromyalgia: Secondary | ICD-10-CM | POA: Diagnosis not present

## 2021-06-29 DIAGNOSIS — E7800 Pure hypercholesterolemia, unspecified: Secondary | ICD-10-CM | POA: Diagnosis not present

## 2021-06-29 DIAGNOSIS — E559 Vitamin D deficiency, unspecified: Secondary | ICD-10-CM | POA: Diagnosis not present

## 2021-06-29 DIAGNOSIS — R0602 Shortness of breath: Secondary | ICD-10-CM | POA: Diagnosis not present

## 2021-06-29 DIAGNOSIS — R5383 Other fatigue: Secondary | ICD-10-CM | POA: Diagnosis not present

## 2021-06-29 DIAGNOSIS — E663 Overweight: Secondary | ICD-10-CM | POA: Diagnosis not present

## 2021-06-29 DIAGNOSIS — Z1389 Encounter for screening for other disorder: Secondary | ICD-10-CM | POA: Diagnosis not present

## 2021-06-29 DIAGNOSIS — R7303 Prediabetes: Secondary | ICD-10-CM | POA: Diagnosis not present

## 2021-06-29 DIAGNOSIS — M8580 Other specified disorders of bone density and structure, unspecified site: Secondary | ICD-10-CM | POA: Diagnosis not present

## 2021-06-29 DIAGNOSIS — M179 Osteoarthritis of knee, unspecified: Secondary | ICD-10-CM | POA: Diagnosis not present

## 2021-07-13 DIAGNOSIS — R7303 Prediabetes: Secondary | ICD-10-CM | POA: Diagnosis not present

## 2021-07-13 DIAGNOSIS — E663 Overweight: Secondary | ICD-10-CM | POA: Diagnosis not present

## 2021-07-13 DIAGNOSIS — M797 Fibromyalgia: Secondary | ICD-10-CM | POA: Diagnosis not present

## 2021-07-13 DIAGNOSIS — E785 Hyperlipidemia, unspecified: Secondary | ICD-10-CM | POA: Diagnosis not present

## 2021-07-13 DIAGNOSIS — M8580 Other specified disorders of bone density and structure, unspecified site: Secondary | ICD-10-CM | POA: Diagnosis not present

## 2021-07-21 DIAGNOSIS — M170 Bilateral primary osteoarthritis of knee: Secondary | ICD-10-CM | POA: Diagnosis not present

## 2021-08-09 DIAGNOSIS — E785 Hyperlipidemia, unspecified: Secondary | ICD-10-CM | POA: Diagnosis not present

## 2021-08-09 DIAGNOSIS — R632 Polyphagia: Secondary | ICD-10-CM | POA: Diagnosis not present

## 2021-08-09 DIAGNOSIS — Z6827 Body mass index (BMI) 27.0-27.9, adult: Secondary | ICD-10-CM | POA: Diagnosis not present

## 2021-08-24 DIAGNOSIS — R7303 Prediabetes: Secondary | ICD-10-CM | POA: Diagnosis not present

## 2021-08-24 DIAGNOSIS — E7800 Pure hypercholesterolemia, unspecified: Secondary | ICD-10-CM | POA: Diagnosis not present

## 2021-08-24 DIAGNOSIS — E663 Overweight: Secondary | ICD-10-CM | POA: Diagnosis not present

## 2021-08-24 DIAGNOSIS — M25561 Pain in right knee: Secondary | ICD-10-CM | POA: Diagnosis not present

## 2021-08-25 ENCOUNTER — Other Ambulatory Visit: Payer: Self-pay | Admitting: Family Medicine

## 2021-08-25 DIAGNOSIS — E7800 Pure hypercholesterolemia, unspecified: Secondary | ICD-10-CM

## 2021-09-02 DIAGNOSIS — M170 Bilateral primary osteoarthritis of knee: Secondary | ICD-10-CM | POA: Diagnosis not present

## 2021-09-07 DIAGNOSIS — M25561 Pain in right knee: Secondary | ICD-10-CM | POA: Diagnosis not present

## 2021-09-07 DIAGNOSIS — E7800 Pure hypercholesterolemia, unspecified: Secondary | ICD-10-CM | POA: Diagnosis not present

## 2021-09-07 DIAGNOSIS — R7303 Prediabetes: Secondary | ICD-10-CM | POA: Diagnosis not present

## 2021-09-07 DIAGNOSIS — E663 Overweight: Secondary | ICD-10-CM | POA: Diagnosis not present

## 2021-09-21 DIAGNOSIS — E7800 Pure hypercholesterolemia, unspecified: Secondary | ICD-10-CM | POA: Diagnosis not present

## 2021-09-21 DIAGNOSIS — E663 Overweight: Secondary | ICD-10-CM | POA: Diagnosis not present

## 2021-09-21 DIAGNOSIS — M25561 Pain in right knee: Secondary | ICD-10-CM | POA: Diagnosis not present

## 2021-09-21 DIAGNOSIS — R7303 Prediabetes: Secondary | ICD-10-CM | POA: Diagnosis not present

## 2021-10-12 DIAGNOSIS — R5383 Other fatigue: Secondary | ICD-10-CM | POA: Diagnosis not present

## 2021-10-12 DIAGNOSIS — E663 Overweight: Secondary | ICD-10-CM | POA: Diagnosis not present

## 2021-10-12 DIAGNOSIS — R7303 Prediabetes: Secondary | ICD-10-CM | POA: Diagnosis not present

## 2021-10-12 DIAGNOSIS — M179 Osteoarthritis of knee, unspecified: Secondary | ICD-10-CM | POA: Diagnosis not present

## 2021-10-12 DIAGNOSIS — Z6826 Body mass index (BMI) 26.0-26.9, adult: Secondary | ICD-10-CM | POA: Diagnosis not present

## 2021-10-19 DIAGNOSIS — M1711 Unilateral primary osteoarthritis, right knee: Secondary | ICD-10-CM | POA: Diagnosis not present

## 2021-10-21 ENCOUNTER — Ambulatory Visit
Admission: RE | Admit: 2021-10-21 | Discharge: 2021-10-21 | Disposition: A | Discharge status: Home / Self Care | Payer: No Typology Code available for payment source | Source: Ambulatory Visit | Attending: Family Medicine | Admitting: Family Medicine

## 2021-10-21 DIAGNOSIS — Z0389 Encounter for observation for other suspected diseases and conditions ruled out: Secondary | ICD-10-CM | POA: Diagnosis not present

## 2021-10-21 DIAGNOSIS — E7800 Pure hypercholesterolemia, unspecified: Secondary | ICD-10-CM

## 2021-10-25 DIAGNOSIS — Z0189 Encounter for other specified special examinations: Secondary | ICD-10-CM | POA: Diagnosis not present

## 2021-10-25 DIAGNOSIS — Z01812 Encounter for preprocedural laboratory examination: Secondary | ICD-10-CM | POA: Diagnosis not present

## 2021-10-26 DIAGNOSIS — E663 Overweight: Secondary | ICD-10-CM | POA: Diagnosis not present

## 2021-10-26 DIAGNOSIS — E7800 Pure hypercholesterolemia, unspecified: Secondary | ICD-10-CM | POA: Diagnosis not present

## 2021-10-26 DIAGNOSIS — M1711 Unilateral primary osteoarthritis, right knee: Secondary | ICD-10-CM | POA: Diagnosis not present

## 2021-10-26 DIAGNOSIS — R7303 Prediabetes: Secondary | ICD-10-CM | POA: Diagnosis not present

## 2021-11-10 DIAGNOSIS — M1711 Unilateral primary osteoarthritis, right knee: Secondary | ICD-10-CM | POA: Diagnosis not present

## 2021-11-10 DIAGNOSIS — Z96651 Presence of right artificial knee joint: Secondary | ICD-10-CM | POA: Diagnosis not present

## 2021-11-10 DIAGNOSIS — G8918 Other acute postprocedural pain: Secondary | ICD-10-CM | POA: Diagnosis not present

## 2021-11-12 DIAGNOSIS — M25561 Pain in right knee: Secondary | ICD-10-CM | POA: Diagnosis not present

## 2021-11-15 DIAGNOSIS — M25561 Pain in right knee: Secondary | ICD-10-CM | POA: Diagnosis not present

## 2021-11-17 DIAGNOSIS — M25561 Pain in right knee: Secondary | ICD-10-CM | POA: Diagnosis not present

## 2021-11-19 DIAGNOSIS — M25561 Pain in right knee: Secondary | ICD-10-CM | POA: Diagnosis not present

## 2021-11-22 DIAGNOSIS — M25561 Pain in right knee: Secondary | ICD-10-CM | POA: Diagnosis not present

## 2021-11-24 DIAGNOSIS — M25561 Pain in right knee: Secondary | ICD-10-CM | POA: Diagnosis not present

## 2021-11-26 DIAGNOSIS — M25561 Pain in right knee: Secondary | ICD-10-CM | POA: Diagnosis not present

## 2021-11-30 DIAGNOSIS — M25561 Pain in right knee: Secondary | ICD-10-CM | POA: Diagnosis not present

## 2021-12-02 DIAGNOSIS — M25561 Pain in right knee: Secondary | ICD-10-CM | POA: Diagnosis not present

## 2021-12-06 DIAGNOSIS — M25561 Pain in right knee: Secondary | ICD-10-CM | POA: Diagnosis not present

## 2021-12-08 DIAGNOSIS — M25561 Pain in right knee: Secondary | ICD-10-CM | POA: Diagnosis not present

## 2021-12-14 DIAGNOSIS — Z5189 Encounter for other specified aftercare: Secondary | ICD-10-CM | POA: Diagnosis not present

## 2021-12-14 DIAGNOSIS — M25561 Pain in right knee: Secondary | ICD-10-CM | POA: Diagnosis not present

## 2021-12-16 DIAGNOSIS — M25561 Pain in right knee: Secondary | ICD-10-CM | POA: Diagnosis not present

## 2021-12-21 DIAGNOSIS — M25561 Pain in right knee: Secondary | ICD-10-CM | POA: Diagnosis not present

## 2021-12-23 DIAGNOSIS — M25561 Pain in right knee: Secondary | ICD-10-CM | POA: Diagnosis not present

## 2021-12-29 ENCOUNTER — Other Ambulatory Visit: Payer: Self-pay | Admitting: Family Medicine

## 2021-12-29 DIAGNOSIS — L409 Psoriasis, unspecified: Secondary | ICD-10-CM | POA: Diagnosis not present

## 2021-12-29 DIAGNOSIS — M25561 Pain in right knee: Secondary | ICD-10-CM | POA: Diagnosis not present

## 2021-12-29 DIAGNOSIS — R7309 Other abnormal glucose: Secondary | ICD-10-CM | POA: Diagnosis not present

## 2021-12-29 DIAGNOSIS — D692 Other nonthrombocytopenic purpura: Secondary | ICD-10-CM | POA: Diagnosis not present

## 2021-12-29 DIAGNOSIS — M8580 Other specified disorders of bone density and structure, unspecified site: Secondary | ICD-10-CM | POA: Diagnosis not present

## 2021-12-29 DIAGNOSIS — Z0000 Encounter for general adult medical examination without abnormal findings: Secondary | ICD-10-CM | POA: Diagnosis not present

## 2021-12-29 DIAGNOSIS — I700 Atherosclerosis of aorta: Secondary | ICD-10-CM | POA: Diagnosis not present

## 2021-12-29 DIAGNOSIS — E559 Vitamin D deficiency, unspecified: Secondary | ICD-10-CM | POA: Diagnosis not present

## 2021-12-29 DIAGNOSIS — E7800 Pure hypercholesterolemia, unspecified: Secondary | ICD-10-CM | POA: Diagnosis not present

## 2021-12-29 DIAGNOSIS — M797 Fibromyalgia: Secondary | ICD-10-CM | POA: Diagnosis not present

## 2021-12-29 DIAGNOSIS — Z23 Encounter for immunization: Secondary | ICD-10-CM | POA: Diagnosis not present

## 2021-12-29 DIAGNOSIS — G629 Polyneuropathy, unspecified: Secondary | ICD-10-CM | POA: Diagnosis not present

## 2021-12-31 DIAGNOSIS — M25561 Pain in right knee: Secondary | ICD-10-CM | POA: Diagnosis not present

## 2022-01-03 DIAGNOSIS — R7303 Prediabetes: Secondary | ICD-10-CM | POA: Diagnosis not present

## 2022-01-03 DIAGNOSIS — E663 Overweight: Secondary | ICD-10-CM | POA: Diagnosis not present

## 2022-01-03 DIAGNOSIS — Z6827 Body mass index (BMI) 27.0-27.9, adult: Secondary | ICD-10-CM | POA: Diagnosis not present

## 2022-01-03 DIAGNOSIS — E7800 Pure hypercholesterolemia, unspecified: Secondary | ICD-10-CM | POA: Diagnosis not present

## 2022-01-03 DIAGNOSIS — E559 Vitamin D deficiency, unspecified: Secondary | ICD-10-CM | POA: Diagnosis not present

## 2022-01-05 DIAGNOSIS — M25561 Pain in right knee: Secondary | ICD-10-CM | POA: Diagnosis not present

## 2022-01-07 DIAGNOSIS — M25561 Pain in right knee: Secondary | ICD-10-CM | POA: Diagnosis not present

## 2022-01-11 DIAGNOSIS — M25561 Pain in right knee: Secondary | ICD-10-CM | POA: Diagnosis not present

## 2022-03-08 DIAGNOSIS — E663 Overweight: Secondary | ICD-10-CM | POA: Diagnosis not present

## 2022-03-08 DIAGNOSIS — Z6826 Body mass index (BMI) 26.0-26.9, adult: Secondary | ICD-10-CM | POA: Diagnosis not present

## 2022-03-08 DIAGNOSIS — L659 Nonscarring hair loss, unspecified: Secondary | ICD-10-CM | POA: Diagnosis not present

## 2022-03-08 DIAGNOSIS — Z6827 Body mass index (BMI) 27.0-27.9, adult: Secondary | ICD-10-CM | POA: Diagnosis not present

## 2022-03-08 DIAGNOSIS — R7303 Prediabetes: Secondary | ICD-10-CM | POA: Diagnosis not present

## 2022-03-16 DIAGNOSIS — H2513 Age-related nuclear cataract, bilateral: Secondary | ICD-10-CM | POA: Diagnosis not present

## 2022-03-16 DIAGNOSIS — Z0100 Encounter for examination of eyes and vision without abnormal findings: Secondary | ICD-10-CM | POA: Diagnosis not present

## 2022-05-23 DIAGNOSIS — L209 Atopic dermatitis, unspecified: Secondary | ICD-10-CM | POA: Diagnosis not present

## 2022-06-20 ENCOUNTER — Ambulatory Visit
Admission: RE | Admit: 2022-06-20 | Discharge: 2022-06-20 | Disposition: A | Discharge status: Home / Self Care | Payer: Medicare HMO | Source: Ambulatory Visit | Attending: Family Medicine | Admitting: Family Medicine

## 2022-06-20 DIAGNOSIS — M85851 Other specified disorders of bone density and structure, right thigh: Secondary | ICD-10-CM | POA: Diagnosis not present

## 2022-06-20 DIAGNOSIS — M8580 Other specified disorders of bone density and structure, unspecified site: Secondary | ICD-10-CM

## 2022-06-20 DIAGNOSIS — Z780 Asymptomatic menopausal state: Secondary | ICD-10-CM | POA: Diagnosis not present

## 2022-12-21 ENCOUNTER — Encounter: Payer: Self-pay | Admitting: Gastroenterology

## 2023-01-10 ENCOUNTER — Other Ambulatory Visit: Payer: Self-pay | Admitting: Family Medicine

## 2023-01-10 DIAGNOSIS — Z1231 Encounter for screening mammogram for malignant neoplasm of breast: Secondary | ICD-10-CM

## 2023-01-19 ENCOUNTER — Ambulatory Visit: Payer: Medicare HMO

## 2023-01-19 ENCOUNTER — Encounter: Payer: Self-pay | Admitting: Gastroenterology

## 2023-01-19 VITALS — Ht 70.0 in | Wt 190.0 lb

## 2023-01-19 DIAGNOSIS — Z1211 Encounter for screening for malignant neoplasm of colon: Secondary | ICD-10-CM

## 2023-01-19 MED ORDER — NA SULFATE-K SULFATE-MG SULF 17.5-3.13-1.6 GM/177ML PO SOLN: 1.0000 | Freq: Once | ORAL | 0 refills | Status: AC

## 2023-01-19 NOTE — Progress Notes (Signed)

## 2023-02-06 ENCOUNTER — Encounter: Payer: Medicare HMO | Admitting: Gastroenterology

## 2023-02-09 ENCOUNTER — Ambulatory Visit: Payer: Medicare HMO

## 2023-03-08 ENCOUNTER — Ambulatory Visit
Admission: RE | Admit: 2023-03-08 | Discharge: 2023-03-08 | Disposition: A | Discharge status: Home / Self Care | Payer: Medicare HMO | Source: Ambulatory Visit | Attending: Family Medicine | Admitting: Family Medicine

## 2023-03-08 DIAGNOSIS — Z1231 Encounter for screening mammogram for malignant neoplasm of breast: Secondary | ICD-10-CM | POA: Diagnosis not present

## 2023-03-23 ENCOUNTER — Encounter: Payer: Self-pay | Admitting: Gastroenterology

## 2023-03-23 ENCOUNTER — Ambulatory Visit: Payer: Medicare HMO | Admitting: Gastroenterology

## 2023-03-23 VITALS — BP 138/84 | HR 55 | Temp 98.0°F | Resp 15 | Ht 70.0 in | Wt 190.0 lb

## 2023-03-23 DIAGNOSIS — D122 Benign neoplasm of ascending colon: Secondary | ICD-10-CM

## 2023-03-23 DIAGNOSIS — E785 Hyperlipidemia, unspecified: Secondary | ICD-10-CM | POA: Diagnosis not present

## 2023-03-23 DIAGNOSIS — Z1211 Encounter for screening for malignant neoplasm of colon: Secondary | ICD-10-CM | POA: Diagnosis not present

## 2023-03-23 DIAGNOSIS — D125 Benign neoplasm of sigmoid colon: Secondary | ICD-10-CM | POA: Diagnosis not present

## 2023-03-23 DIAGNOSIS — K5730 Diverticulosis of large intestine without perforation or abscess without bleeding: Secondary | ICD-10-CM | POA: Diagnosis not present

## 2023-03-23 DIAGNOSIS — D124 Benign neoplasm of descending colon: Secondary | ICD-10-CM

## 2023-03-23 DIAGNOSIS — D123 Benign neoplasm of transverse colon: Secondary | ICD-10-CM | POA: Diagnosis not present

## 2023-03-23 MED ORDER — SODIUM CHLORIDE 0.9 % IV SOLN: 500.0000 mL | Freq: Once | INTRAVENOUS | Status: DC

## 2023-03-23 NOTE — Progress Notes (Signed)
Sedate, gd SR, tolerated procedure well, VSS, report to RN 

## 2023-03-23 NOTE — Progress Notes (Signed)
Pt's states no medical or surgical changes since previsit or office visit. 

## 2023-03-23 NOTE — Patient Instructions (Addendum)
- Polyps removed and sent to pathology. Diverticulosis                                                - Resume previous diet.                           - Continue present medications.                           - Await pathology results.                           - Repeat colonoscopy (date not yet determined) for                            surveillance based on pathology results.  YOU HAD AN ENDOSCOPIC PROCEDURE TODAY AT THE [facility]:   Refer to the procedure report that was given to you for any specific questions about what was found during the examination.  If the procedure report does not answer your questions, please call your gastroenterologist to clarify.  If you requested that your care partner not be given the details of your procedure findings, then the procedure report has been included in a sealed envelope for you to review at your convenience later.  YOU SHOULD EXPECT: Some feelings of bloating in the abdomen. Passage of more gas than usual.  Walking can help get rid of the air that was put into your GI tract during the procedure and reduce the bloating. If you had a lower endoscopy (such as a colonoscopy or flexible sigmoidoscopy) you may notice spotting of blood in your stool or on the toilet paper. If you underwent a bowel prep for your procedure, you may not have a normal bowel movement for a few days.  Please Note:  You might notice some irritation and congestion in your nose or some drainage.  This is from the oxygen used during your procedure.  There is no need for concern and it should clear up in a day or so.  SYMPTOMS TO REPORT IMMEDIATELY:  Following lower endoscopy (colonoscopy or flexible sigmoidoscopy):  Excessive amounts of blood in the stool  Significant tenderness or worsening of abdominal pains  Swelling of the abdomen that is new, acute  Fever of 100F or higher   For urgent or emergent issues, a gastroenterologist can be  reached at any hour by calling [phone]. Do not use MyChart messaging for urgent concerns.    DIET:  We do recommend a small meal at first, but then you may proceed to your regular diet.  Drink plenty of fluids but you should avoid alcoholic beverages for 24 hours.  ACTIVITY:  You should plan to take it easy for the rest of today and you should NOT DRIVE or use heavy machinery until tomorrow (because of the sedation medicines used during the test).    FOLLOW UP: Our staff will call the number listed on your records the next business day following your procedure.  We will call around 7:15- 8:00 am to check on you and address any questions or concerns that you may have regarding the information given to you following your procedure.  If we do not reach you, we will leave a message.     If any biopsies were taken you will be contacted by phone or by letter within the next 1-3 weeks.  Please call us at [phone] if you have not heard about the biopsies in 3 weeks.    SIGNATURES/CONFIDENTIALITY: You and/or your care partner have signed paperwork which will be entered into your electronic medical record.  These signatures attest to the fact that that the information above on your After Visit Summary has been reviewed and is understood.  Full responsibility of the confidentiality of this discharge information lies with you and/or your care-partner.

## 2023-03-23 NOTE — Op Note (Signed)
[facility] Patient Name: [name] Procedure Date: 03/23/2023 11:20 AM MRN: [id number] Endoscopist: Lorin Picket E. Tomasa Rand , MD, [id number] Age: 73 Referring MD:  Date of Birth: 20-Mar-1950 Gender: Female Account #: [id number] Procedure:                Colonoscopy Indications:              Screening for colorectal malignant neoplasm (last                            colonoscopy was 10 years ago). No history of polyps Medicines:                Monitored Anesthesia Care Procedure:                Pre-Anesthesia Assessment:                           - Prior to the procedure, a History and Physical                            was performed, and patient medications and                            allergies were reviewed. The patient's tolerance of                            previous anesthesia was also reviewed. The risks                            and benefits of the procedure and the sedation                            options and risks were discussed with the patient.                            All questions were answered, and informed consent                            was obtained. Prior Anticoagulants: The patient has                            taken no anticoagulant or antiplatelet agents. ASA                            Grade Assessment: II - A patient with mild systemic                            disease. After reviewing the risks and benefits,                            the patient was deemed in satisfactory condition to                            undergo the procedure.  After obtaining informed consent, the colonoscope                            was passed under direct vision. Throughout the                            procedure, the patient's blood pressure, pulse, and                            oxygen saturations were monitored continuously. The                            Olympus Scope SN: [id number] was introduced through                             the anus and advanced to the the terminal ileum,                            with identification of the appendiceal orifice and                            IC valve. The colonoscopy was performed without                            difficulty. The patient tolerated the procedure                            well. The quality of the bowel preparation was                            adequate. The terminal ileum, ileocecal valve,                            appendiceal orifice, and rectum were photographed.                            The bowel preparation used was SUPREP via split                            dose instruction. Scope In: 11:38:23 AM Scope Out: 12:02:52 PM Scope Withdrawal Time: 0 hours 18 minutes 49 seconds  Total Procedure Duration: 0 hours 24 minutes 29 seconds  Findings:                 The perianal and digital rectal examinations were                            normal. Pertinent negatives include normal                            sphincter tone and no palpable rectal lesions.                           Two sessile polyps were found in the ascending  colon. The polyps were 3 to 4 mm in size. These                            polyps were removed with a cold snare. Resection                            and retrieval were complete. Estimated blood loss                            was minimal.                           Two sessile polyps were found in the hepatic                            flexure. The polyps were 3 to 4 mm in size. These                            polyps were removed with a cold snare. Resection                            and retrieval were complete. Estimated blood loss                            was minimal.                           A 4 mm polyp was found in the transverse colon. The                            polyp was sessile. The polyp was removed with a                            cold snare. Resection and retrieval were complete.                             Estimated blood loss was minimal.                           A 4 mm polyp was found in the descending colon. The                            polyp was sessile. The polyp was removed with a                            cold snare. Resection and retrieval were complete.                            Estimated blood loss was minimal.                           A 3 mm polyp was found in the sigmoid colon. The  polyp was sessile. The polyp was removed with a                            cold snare. Resection and retrieval were complete.                            Estimated blood loss was minimal.                           Many medium-mouthed and small-mouthed diverticula                            were found in the sigmoid colon, descending colon,                            transverse colon and ascending colon. There was no                            evidence of diverticular bleeding.                           The exam was otherwise normal throughout the                            examined colon.                           The terminal ileum appeared normal.                           The retroflexed view of the distal rectum and anal                            verge was normal and showed no anal or rectal                            abnormalities. Complications:            No immediate complications. Estimated Blood Loss:     Estimated blood loss was minimal. Impression:               - Two 3 to 4 mm polyps in the ascending colon,                            removed with a cold snare. Resected and retrieved.                           - Two 3 to 4 mm polyps at the hepatic flexure,                            removed with a cold snare. Resected and retrieved.                           - One 4 mm polyp in the transverse colon, removed  with a cold snare. Resected and retrieved.                           - One 4 mm polyp in the descending  colon, removed                            with a cold snare. Resected and retrieved.                           - One 3 mm polyp in the sigmoid colon, removed with                            a cold snare. Resected and retrieved.                           - Moderate diverticulosis in the sigmoid colon, in                            the descending colon, in the transverse colon and                            in the ascending colon. There was no evidence of                            diverticular bleeding.                           - The examined portion of the ileum was normal.                           - The distal rectum and anal verge are normal on                            retroflexion view. Recommendation:           - Patient has a contact number available for                            emergencies. The signs and symptoms of potential                            delayed complications were discussed with the                            patient. Return to normal activities tomorrow.                            Written discharge instructions were provided to the                            patient.                           - Resume previous diet.                           -  Continue present medications.                           - Await pathology results.                           - Repeat colonoscopy (date not yet determined) for                            surveillance based on pathology results. Yvette Roark E. Tomasa Rand, MD 03/23/2023 12:09:56 PM This report has been signed electronically.

## 2023-03-23 NOTE — Progress Notes (Signed)
[facility] History and Physical   Primary Care Physician:  Lupita Raider, MD   Reason for Procedure:   Colon cancer screening  Plan:    Screening colonoscopy     HPI: [name] is a 73 y.o. female undergoing average risk screening colonoscopy.  She has no family history of colon cancer and no chronic GI symptoms.  She had a normal colonoscopy in Nov 2014.   Past Medical History:  Diagnosis Date   Fibromyalgia muscle pain 2007   Hyperlipidemia 2012   Menopausal state 08/11/2007   On Prometrium qomonth   Shingles 2008   Right side of face & neck tx. w/ prednisone    Past Surgical History:  Procedure Laterality Date   CERVICAL BIOPSY  W/ LOOP ELECTRODE EXCISION  07/22/1999   squamous dysplasia, margins clear; Dr. Patricia Pesa   COLPOSCOPY W/ BIOPSY / CURETTAGE  07/05/1999   CIN II, ECC benign; DR. Patricia Pesa   KNEE ARTHROSCOPY     torn ligaments, right   LUMBAR DISC ARTHROPLASTY  01/2007   fusion L4-L5   LUMBAR DISC SURGERY  12/1998   L5-S1   LUMBAR DISC SURGERY  02/2014   bone spurs and fusion of F5   TUBAL LIGATION  1982    Prior to Admission medications   Medication Sig Start Date End Date Taking? Authorizing Provider  escitalopram (LEXAPRO) 20 MG tablet Take 20 mg by mouth daily.   Yes [provider]  gabapentin (NEURONTIN) 300 MG capsule 1 capsule Orally Twice a day for 90 days 01/11/23  Yes [provider]  pravastatin (PRAVACHOL) 40 MG tablet Take 1 tablet by mouth daily.   Yes [provider]  calcium-vitamin D (OSCAL WITH D) 500-200 MG-UNIT per tablet Take 1 tablet by mouth daily.    [provider]  Dupilumab (DUPIXENT) 300 MG/2ML SOAJ INJECT 300MG  (1 PEN) SUBCUTANEOUSLY EVERY 2 WEEKS AS DIRECTED.    [provider]  fish oil-omega-3 fatty acids 1000 MG capsule Take 2 g by mouth daily.    [provider]  triamcinolone cream (KENALOG) 0.1 % triamcinolone acetonide 0.1 % topical cream Patient not  taking: Reported on 01/19/2023    [provider]    Current Outpatient Medications  Medication Sig Dispense Refill   escitalopram (LEXAPRO) 20 MG tablet Take 20 mg by mouth daily.     gabapentin (NEURONTIN) 300 MG capsule 1 capsule Orally Twice a day for 90 days     pravastatin (PRAVACHOL) 40 MG tablet Take 1 tablet by mouth daily.     calcium-vitamin D (OSCAL WITH D) 500-200 MG-UNIT per tablet Take 1 tablet by mouth daily.     Dupilumab (DUPIXENT) 300 MG/2ML SOAJ INJECT 300MG  (1 PEN) SUBCUTANEOUSLY EVERY 2 WEEKS AS DIRECTED.     fish oil-omega-3 fatty acids 1000 MG capsule Take 2 g by mouth daily.     triamcinolone cream (KENALOG) 0.1 % triamcinolone acetonide 0.1 % topical cream (Patient not taking: Reported on 01/19/2023)     Current Facility-Administered Medications  Medication Dose Route Frequency Provider Last Rate Last Admin   0.9 %  sodium chloride infusion  500 mL Intravenous Once Jenel Lucks, MD        Allergies as of 03/23/2023 - Review Complete 03/23/2023  Allergen Reaction Noted   Lanolin Itching 01/19/2023    Family History  Problem Relation Age of Onset   Cancer Mother        thymus cancer 2003   Diabetes Father  Hypertension Father    Hyperlipidemia Father    Stroke Maternal Grandmother    Colon polyps Maternal Grandmother    Diabetes Paternal Grandmother    Heart failure Paternal Grandmother    Colon cancer Neg Hx    Pancreatic cancer Neg Hx    Stomach cancer Neg Hx    Rectal cancer Neg Hx    Esophageal cancer Neg Hx     Social History   Socioeconomic History   Marital status: Married    Spouse name: [name]   Number of children: 2   Years of education: Not on file   Highest education level: Not on file  Occupational History   Occupation: Engineer, maintenance: [employer]  Tobacco Use   Smoking status: Never   Smokeless tobacco: Never  Vaping Use   Vaping status: Never Used  Substance  and Sexual Activity   Alcohol use: No    Alcohol/week: 0.0 standard drinks of alcohol   Drug use: No   Sexual activity: Yes    Partners: Male    Birth control/protection: Surgical, Post-menopausal  Other Topics Concern   Not on file  Social History Narrative   Not on file   Social Drivers of Health   Financial Resource Strain: Not on file  Food Insecurity: Not on file  Transportation Needs: Not on file  Physical Activity: Not on file  Stress: Not on file  Social Connections: Not on file  Intimate Partner Violence: Not on file    Review of Systems:  All other review of systems negative except as mentioned in the HPI.  Physical Exam: Vital signs BP (!) 196/88   Pulse 60   Temp 98 F (36.7 C)   Ht 5\' 10"  (1.778 m)   Wt 190 lb (86.2 kg)   LMP 07/30/2007 (Approximate)   SpO2 96%   BMI 27.26 kg/m   General:   Alert,  Well-developed, well-nourished, pleasant and cooperative in NAD Airway:  Mallampati 2 Lungs:  Clear throughout to auscultation.   Heart:  Regular rate and rhythm; no murmurs, clicks, rubs,  or gallops. Abdomen:  Soft, nontender and nondistended. Normal bowel sounds.   Neuro/Psych:  Normal mood and affect. A and O x 3   Mackey Varricchio E. Tomasa Rand, MD [facility]

## 2023-03-24 ENCOUNTER — Telehealth: Payer: Self-pay

## 2023-03-24 NOTE — Telephone Encounter (Signed)
Unable to reach pt.  Left HIPAA compliant message

## 2023-03-27 LAB — SURGICAL PATHOLOGY

## 2023-03-29 ENCOUNTER — Encounter: Payer: Self-pay | Admitting: Gastroenterology

## 2023-03-29 NOTE — Progress Notes (Signed)
Ms. Beckom,   The seven polyps that I removed during your recent procedure were completely benign but were proven to be "pre-cancerous" polyps that MAY have grown into cancers if they had not been removed.  Studies shows that at least 20% of women over age 71 and 30% of men over age 54 have pre-cancerous polyps.  Based on current nationally recognized surveillance guidelines, I recommend that you have a repeat colonoscopy in 3 years.   If you develop any new rectal bleeding, abdominal pain or significant bowel habit changes, please contact me before then.

## 2023-04-05 DIAGNOSIS — H2513 Age-related nuclear cataract, bilateral: Secondary | ICD-10-CM | POA: Diagnosis not present

## 2023-04-05 DIAGNOSIS — H43392 Other vitreous opacities, left eye: Secondary | ICD-10-CM | POA: Diagnosis not present

## 2023-04-05 DIAGNOSIS — D3132 Benign neoplasm of left choroid: Secondary | ICD-10-CM | POA: Diagnosis not present

## 2023-05-11 DIAGNOSIS — R5383 Other fatigue: Secondary | ICD-10-CM | POA: Diagnosis not present

## 2023-05-11 DIAGNOSIS — E785 Hyperlipidemia, unspecified: Secondary | ICD-10-CM | POA: Diagnosis not present

## 2023-05-23 DIAGNOSIS — L209 Atopic dermatitis, unspecified: Secondary | ICD-10-CM | POA: Diagnosis not present

## 2023-05-25 DIAGNOSIS — Z6829 Body mass index (BMI) 29.0-29.9, adult: Secondary | ICD-10-CM | POA: Diagnosis not present

## 2023-05-25 DIAGNOSIS — E785 Hyperlipidemia, unspecified: Secondary | ICD-10-CM | POA: Diagnosis not present

## 2023-05-25 DIAGNOSIS — R7303 Prediabetes: Secondary | ICD-10-CM | POA: Diagnosis not present

## 2023-05-25 DIAGNOSIS — G629 Polyneuropathy, unspecified: Secondary | ICD-10-CM | POA: Diagnosis not present

## 2023-05-25 DIAGNOSIS — E663 Overweight: Secondary | ICD-10-CM | POA: Diagnosis not present

## 2023-05-25 DIAGNOSIS — I10 Essential (primary) hypertension: Secondary | ICD-10-CM | POA: Diagnosis not present

## 2023-05-25 DIAGNOSIS — R5383 Other fatigue: Secondary | ICD-10-CM | POA: Diagnosis not present

## 2023-05-25 DIAGNOSIS — R030 Elevated blood-pressure reading, without diagnosis of hypertension: Secondary | ICD-10-CM | POA: Diagnosis not present

## 2023-05-25 DIAGNOSIS — Z87442 Personal history of urinary calculi: Secondary | ICD-10-CM | POA: Diagnosis not present

## 2023-06-06 DIAGNOSIS — R030 Elevated blood-pressure reading, without diagnosis of hypertension: Secondary | ICD-10-CM | POA: Diagnosis not present

## 2023-06-06 DIAGNOSIS — R5383 Other fatigue: Secondary | ICD-10-CM | POA: Diagnosis not present

## 2023-06-06 DIAGNOSIS — R7303 Prediabetes: Secondary | ICD-10-CM | POA: Diagnosis not present

## 2023-06-06 DIAGNOSIS — M5416 Radiculopathy, lumbar region: Secondary | ICD-10-CM | POA: Diagnosis not present

## 2023-06-06 DIAGNOSIS — I10 Essential (primary) hypertension: Secondary | ICD-10-CM | POA: Diagnosis not present

## 2023-06-06 DIAGNOSIS — E785 Hyperlipidemia, unspecified: Secondary | ICD-10-CM | POA: Diagnosis not present

## 2023-06-06 DIAGNOSIS — E663 Overweight: Secondary | ICD-10-CM | POA: Diagnosis not present

## 2023-06-06 DIAGNOSIS — Z6829 Body mass index (BMI) 29.0-29.9, adult: Secondary | ICD-10-CM | POA: Diagnosis not present

## 2023-06-06 DIAGNOSIS — G629 Polyneuropathy, unspecified: Secondary | ICD-10-CM | POA: Diagnosis not present

## 2023-06-06 DIAGNOSIS — Z87442 Personal history of urinary calculi: Secondary | ICD-10-CM | POA: Diagnosis not present

## 2023-06-06 DIAGNOSIS — Z6828 Body mass index (BMI) 28.0-28.9, adult: Secondary | ICD-10-CM | POA: Diagnosis not present

## 2023-06-22 DIAGNOSIS — R531 Weakness: Secondary | ICD-10-CM | POA: Diagnosis not present

## 2023-06-22 DIAGNOSIS — M4808 Spinal stenosis, sacral and sacrococcygeal region: Secondary | ICD-10-CM | POA: Diagnosis not present

## 2023-06-22 DIAGNOSIS — M5416 Radiculopathy, lumbar region: Secondary | ICD-10-CM | POA: Diagnosis not present

## 2023-06-22 DIAGNOSIS — M5126 Other intervertebral disc displacement, lumbar region: Secondary | ICD-10-CM | POA: Diagnosis not present

## 2023-06-22 DIAGNOSIS — M48061 Spinal stenosis, lumbar region without neurogenic claudication: Secondary | ICD-10-CM | POA: Diagnosis not present

## 2023-06-27 DIAGNOSIS — M4310 Spondylolisthesis, site unspecified: Secondary | ICD-10-CM | POA: Diagnosis not present

## 2023-06-27 DIAGNOSIS — M48062 Spinal stenosis, lumbar region with neurogenic claudication: Secondary | ICD-10-CM | POA: Diagnosis not present

## 2023-06-27 DIAGNOSIS — M5416 Radiculopathy, lumbar region: Secondary | ICD-10-CM | POA: Diagnosis not present

## 2023-07-03 DIAGNOSIS — M4316 Spondylolisthesis, lumbar region: Secondary | ICD-10-CM | POA: Diagnosis not present

## 2023-07-03 DIAGNOSIS — M6281 Muscle weakness (generalized): Secondary | ICD-10-CM | POA: Diagnosis not present

## 2023-07-06 DIAGNOSIS — M6281 Muscle weakness (generalized): Secondary | ICD-10-CM | POA: Diagnosis not present

## 2023-07-06 DIAGNOSIS — M4316 Spondylolisthesis, lumbar region: Secondary | ICD-10-CM | POA: Diagnosis not present

## 2023-07-10 DIAGNOSIS — M6281 Muscle weakness (generalized): Secondary | ICD-10-CM | POA: Diagnosis not present

## 2023-07-10 DIAGNOSIS — R030 Elevated blood-pressure reading, without diagnosis of hypertension: Secondary | ICD-10-CM | POA: Diagnosis not present

## 2023-07-10 DIAGNOSIS — R7303 Prediabetes: Secondary | ICD-10-CM | POA: Diagnosis not present

## 2023-07-10 DIAGNOSIS — I10 Essential (primary) hypertension: Secondary | ICD-10-CM | POA: Diagnosis not present

## 2023-07-10 DIAGNOSIS — R5383 Other fatigue: Secondary | ICD-10-CM | POA: Diagnosis not present

## 2023-07-10 DIAGNOSIS — M4316 Spondylolisthesis, lumbar region: Secondary | ICD-10-CM | POA: Diagnosis not present

## 2023-07-10 DIAGNOSIS — G629 Polyneuropathy, unspecified: Secondary | ICD-10-CM | POA: Diagnosis not present

## 2023-07-10 DIAGNOSIS — E663 Overweight: Secondary | ICD-10-CM | POA: Diagnosis not present

## 2023-07-10 DIAGNOSIS — E785 Hyperlipidemia, unspecified: Secondary | ICD-10-CM | POA: Diagnosis not present

## 2023-07-10 DIAGNOSIS — Z87442 Personal history of urinary calculi: Secondary | ICD-10-CM | POA: Diagnosis not present

## 2023-07-10 DIAGNOSIS — Z6828 Body mass index (BMI) 28.0-28.9, adult: Secondary | ICD-10-CM | POA: Diagnosis not present

## 2023-07-11 DIAGNOSIS — M5416 Radiculopathy, lumbar region: Secondary | ICD-10-CM | POA: Diagnosis not present

## 2023-07-11 DIAGNOSIS — M48062 Spinal stenosis, lumbar region with neurogenic claudication: Secondary | ICD-10-CM | POA: Diagnosis not present

## 2023-07-17 DIAGNOSIS — M6281 Muscle weakness (generalized): Secondary | ICD-10-CM | POA: Diagnosis not present

## 2023-07-17 DIAGNOSIS — M4316 Spondylolisthesis, lumbar region: Secondary | ICD-10-CM | POA: Diagnosis not present

## 2023-07-20 DIAGNOSIS — M6281 Muscle weakness (generalized): Secondary | ICD-10-CM | POA: Diagnosis not present

## 2023-07-20 DIAGNOSIS — M4316 Spondylolisthesis, lumbar region: Secondary | ICD-10-CM | POA: Diagnosis not present

## 2023-07-25 DIAGNOSIS — M4316 Spondylolisthesis, lumbar region: Secondary | ICD-10-CM | POA: Diagnosis not present

## 2023-07-25 DIAGNOSIS — M6281 Muscle weakness (generalized): Secondary | ICD-10-CM | POA: Diagnosis not present

## 2023-07-31 DIAGNOSIS — M6281 Muscle weakness (generalized): Secondary | ICD-10-CM | POA: Diagnosis not present

## 2023-07-31 DIAGNOSIS — M4316 Spondylolisthesis, lumbar region: Secondary | ICD-10-CM | POA: Diagnosis not present

## 2023-08-04 DIAGNOSIS — M6281 Muscle weakness (generalized): Secondary | ICD-10-CM | POA: Diagnosis not present

## 2023-08-04 DIAGNOSIS — M4316 Spondylolisthesis, lumbar region: Secondary | ICD-10-CM | POA: Diagnosis not present

## 2023-08-08 DIAGNOSIS — R5383 Other fatigue: Secondary | ICD-10-CM | POA: Diagnosis not present

## 2023-08-08 DIAGNOSIS — R7303 Prediabetes: Secondary | ICD-10-CM | POA: Diagnosis not present

## 2023-08-08 DIAGNOSIS — I10 Essential (primary) hypertension: Secondary | ICD-10-CM | POA: Diagnosis not present

## 2023-08-08 DIAGNOSIS — Z6828 Body mass index (BMI) 28.0-28.9, adult: Secondary | ICD-10-CM | POA: Diagnosis not present

## 2023-08-08 DIAGNOSIS — G629 Polyneuropathy, unspecified: Secondary | ICD-10-CM | POA: Diagnosis not present

## 2023-08-08 DIAGNOSIS — R030 Elevated blood-pressure reading, without diagnosis of hypertension: Secondary | ICD-10-CM | POA: Diagnosis not present

## 2023-08-08 DIAGNOSIS — E785 Hyperlipidemia, unspecified: Secondary | ICD-10-CM | POA: Diagnosis not present

## 2023-08-08 DIAGNOSIS — E663 Overweight: Secondary | ICD-10-CM | POA: Diagnosis not present

## 2023-08-08 DIAGNOSIS — Z87442 Personal history of urinary calculi: Secondary | ICD-10-CM | POA: Diagnosis not present

## 2023-08-22 IMAGING — MG MM DIGITAL SCREENING BILAT W/ TOMO AND CAD
8 series · 8 of 24 positions shown · non-contrast
Comparison: Previous exam(s).

CLINICAL DATA: Screening.

EXAM:
DIGITAL SCREENING BILATERAL MAMMOGRAM WITH TOMOSYNTHESIS AND CAD
TECHNIQUE: Bilateral screening digital craniocaudal and mediolateral oblique
mammograms were obtained. Bilateral screening digital breast
tomosynthesis was performed. The images were evaluated with
computer-aided detection.

[L MLO synth-2D]
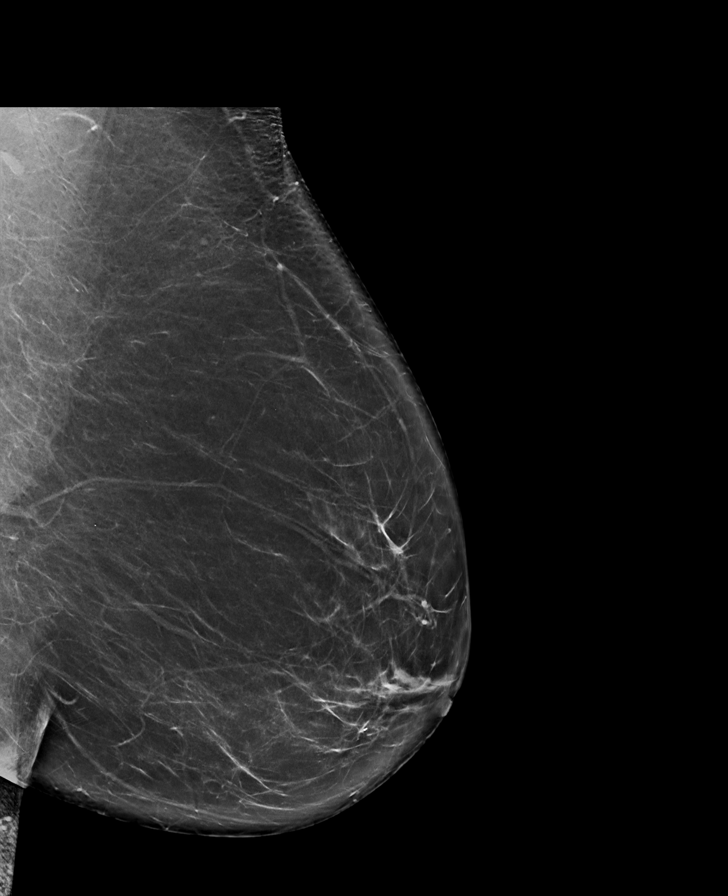

[R MLO synth-2D]
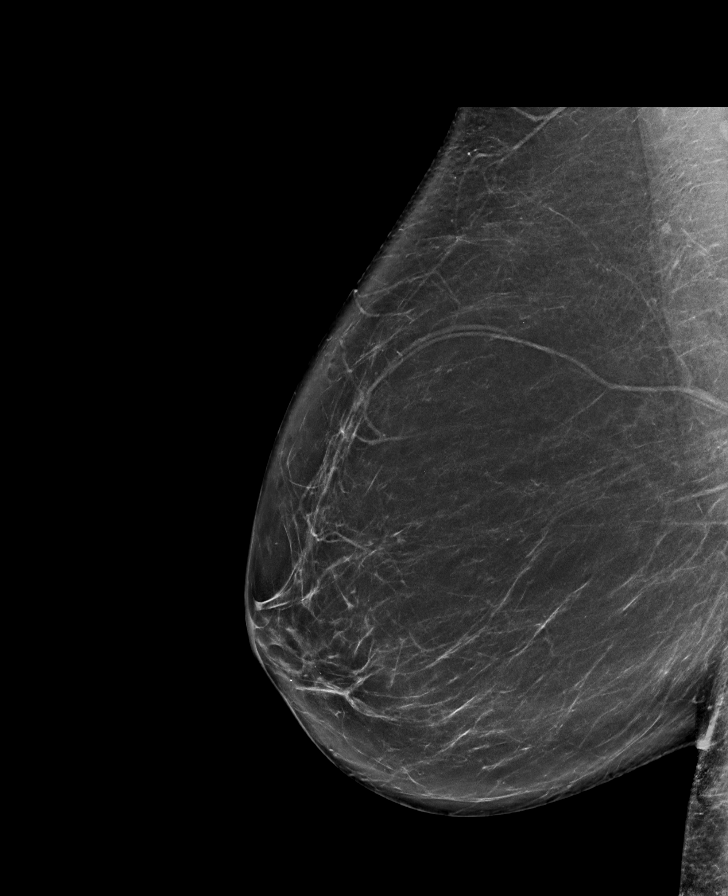

[L CC synth-2D]
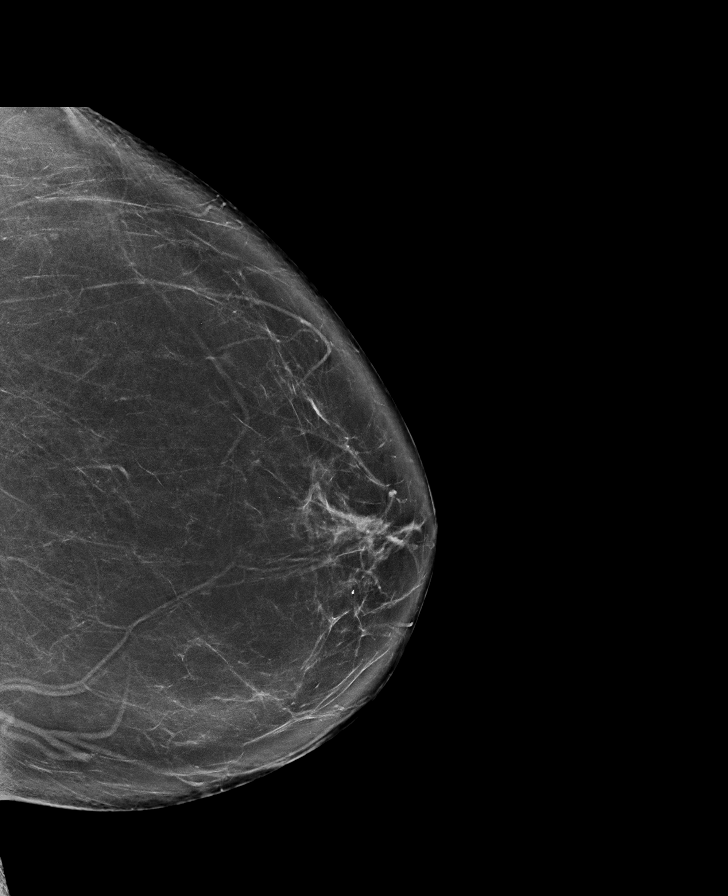

[R CC synth-2D]
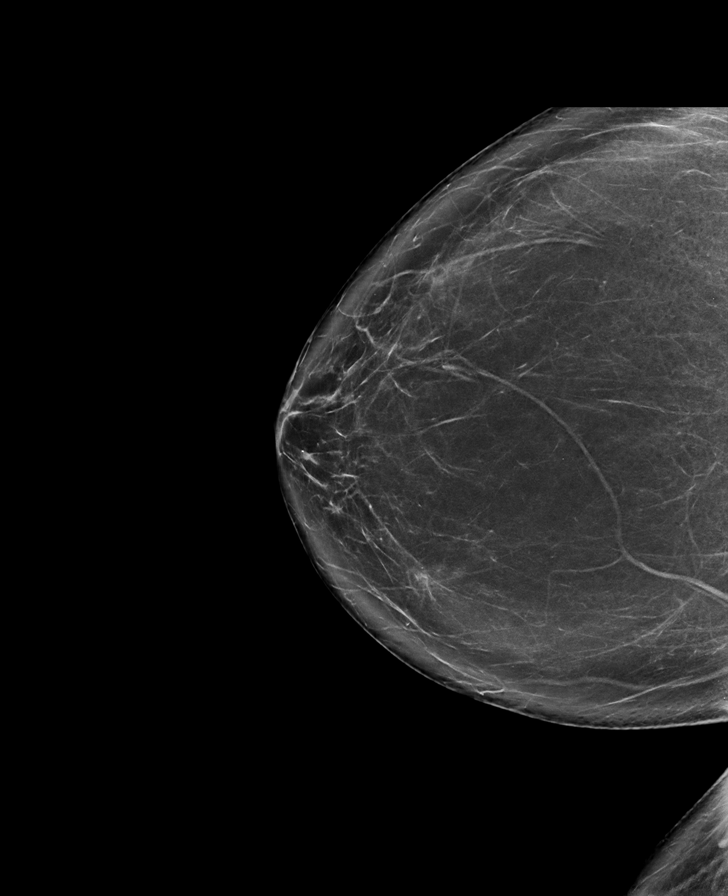

[R CC tomo · tomo slice 43/85.0]
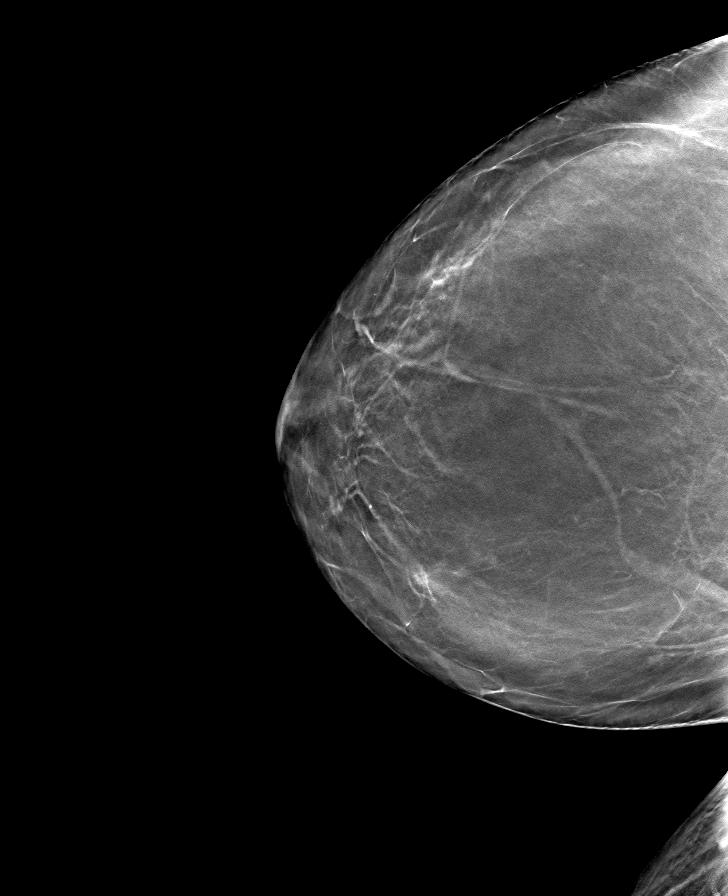

[L MLO tomo · tomo slice 44/87.0]
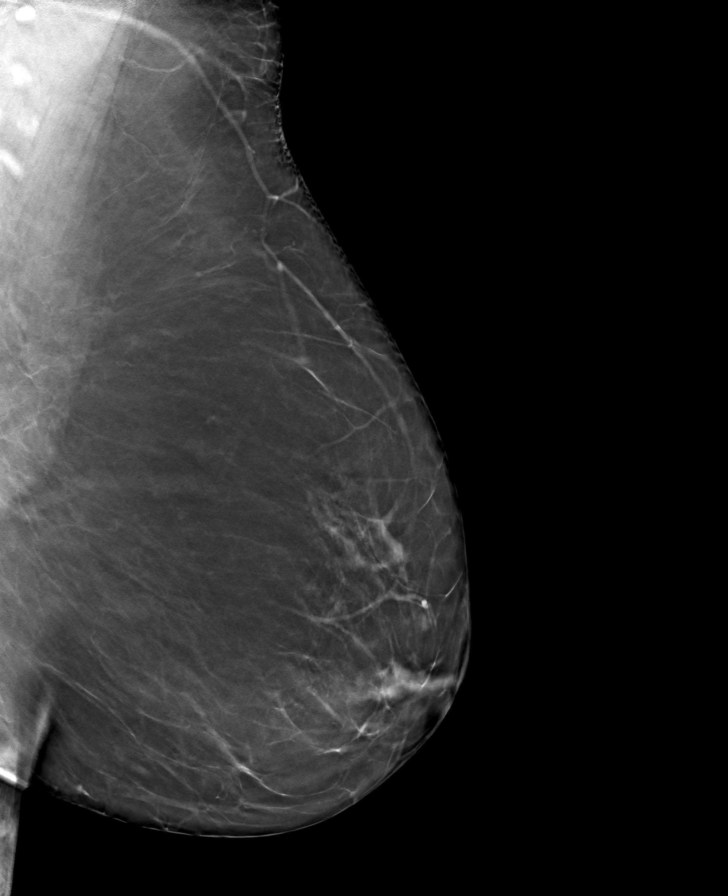

[L CC tomo · tomo slice 44/87.0]
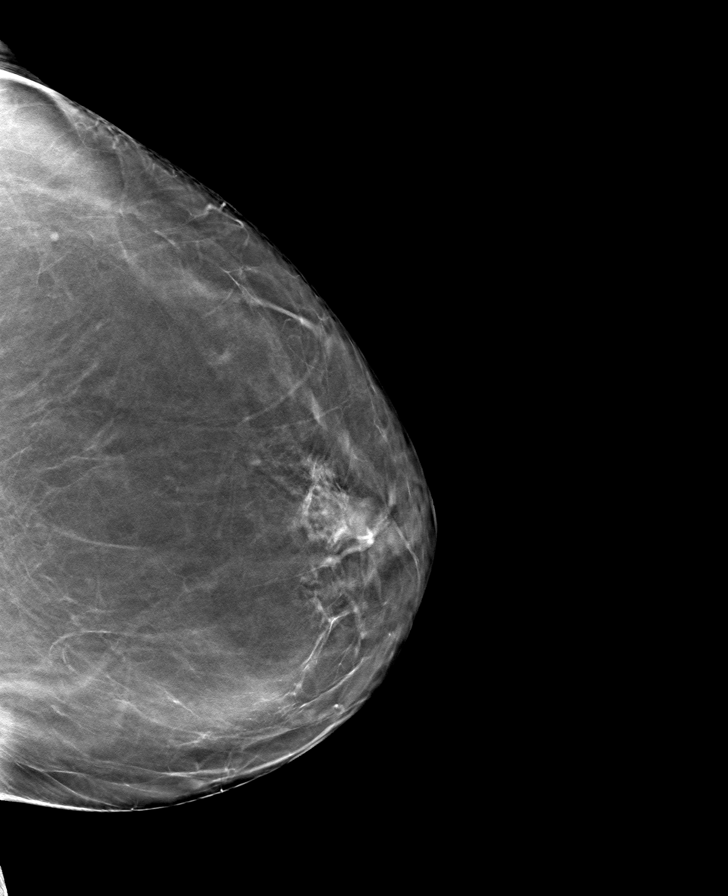

[R MLO tomo · tomo slice 44/87.0]
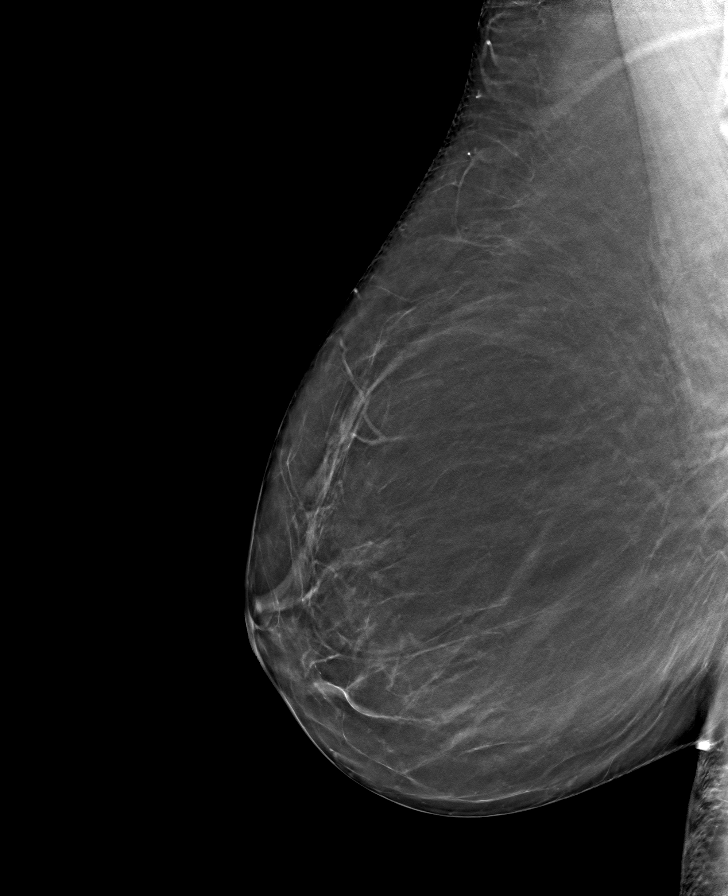

[8 of 24 positions shown; findings below may reference images not displayed]

ACR Breast Density Category b: There are scattered areas of
fibroglandular density.
FINDINGS: There are no findings suspicious for malignancy.
IMPRESSION: No mammographic evidence of malignancy. A result letter of this
screening mammogram will be mailed directly to the patient.

RECOMMENDATION:
Screening mammogram in one year. (Code:51-O-LD2)

BI-RADS CATEGORY  1: Negative.

## 2023-08-29 DIAGNOSIS — E663 Overweight: Secondary | ICD-10-CM | POA: Diagnosis not present

## 2023-08-29 DIAGNOSIS — R7303 Prediabetes: Secondary | ICD-10-CM | POA: Diagnosis not present

## 2023-08-29 DIAGNOSIS — G629 Polyneuropathy, unspecified: Secondary | ICD-10-CM | POA: Diagnosis not present

## 2023-08-29 DIAGNOSIS — Z6828 Body mass index (BMI) 28.0-28.9, adult: Secondary | ICD-10-CM | POA: Diagnosis not present

## 2023-08-29 DIAGNOSIS — E785 Hyperlipidemia, unspecified: Secondary | ICD-10-CM | POA: Diagnosis not present

## 2023-08-29 DIAGNOSIS — Z87442 Personal history of urinary calculi: Secondary | ICD-10-CM | POA: Diagnosis not present

## 2023-08-29 DIAGNOSIS — R030 Elevated blood-pressure reading, without diagnosis of hypertension: Secondary | ICD-10-CM | POA: Diagnosis not present

## 2023-08-29 DIAGNOSIS — R5383 Other fatigue: Secondary | ICD-10-CM | POA: Diagnosis not present

## 2023-08-29 DIAGNOSIS — I10 Essential (primary) hypertension: Secondary | ICD-10-CM | POA: Diagnosis not present

## 2023-10-03 DIAGNOSIS — E663 Overweight: Secondary | ICD-10-CM | POA: Diagnosis not present

## 2023-10-03 DIAGNOSIS — Z6828 Body mass index (BMI) 28.0-28.9, adult: Secondary | ICD-10-CM | POA: Diagnosis not present

## 2023-10-03 DIAGNOSIS — G629 Polyneuropathy, unspecified: Secondary | ICD-10-CM | POA: Diagnosis not present

## 2023-10-03 DIAGNOSIS — Z87442 Personal history of urinary calculi: Secondary | ICD-10-CM | POA: Diagnosis not present

## 2023-10-03 DIAGNOSIS — R7303 Prediabetes: Secondary | ICD-10-CM | POA: Diagnosis not present

## 2023-10-03 DIAGNOSIS — E785 Hyperlipidemia, unspecified: Secondary | ICD-10-CM | POA: Diagnosis not present

## 2023-10-03 DIAGNOSIS — I10 Essential (primary) hypertension: Secondary | ICD-10-CM | POA: Diagnosis not present

## 2023-11-23 DIAGNOSIS — L821 Other seborrheic keratosis: Secondary | ICD-10-CM | POA: Diagnosis not present

## 2023-11-23 DIAGNOSIS — Z413 Encounter for ear piercing: Secondary | ICD-10-CM | POA: Diagnosis not present

## 2024-01-04 DIAGNOSIS — Z6828 Body mass index (BMI) 28.0-28.9, adult: Secondary | ICD-10-CM | POA: Diagnosis not present

## 2024-01-04 DIAGNOSIS — I10 Essential (primary) hypertension: Secondary | ICD-10-CM | POA: Diagnosis not present

## 2024-01-04 DIAGNOSIS — Z87442 Personal history of urinary calculi: Secondary | ICD-10-CM | POA: Diagnosis not present

## 2024-01-04 DIAGNOSIS — E785 Hyperlipidemia, unspecified: Secondary | ICD-10-CM | POA: Diagnosis not present

## 2024-01-04 DIAGNOSIS — G629 Polyneuropathy, unspecified: Secondary | ICD-10-CM | POA: Diagnosis not present

## 2024-01-04 DIAGNOSIS — E663 Overweight: Secondary | ICD-10-CM | POA: Diagnosis not present

## 2024-01-04 DIAGNOSIS — R7303 Prediabetes: Secondary | ICD-10-CM | POA: Diagnosis not present

## 2024-01-16 DIAGNOSIS — Z6829 Body mass index (BMI) 29.0-29.9, adult: Secondary | ICD-10-CM | POA: Diagnosis not present

## 2024-01-16 DIAGNOSIS — G629 Polyneuropathy, unspecified: Secondary | ICD-10-CM | POA: Diagnosis not present

## 2024-01-16 DIAGNOSIS — Z87442 Personal history of urinary calculi: Secondary | ICD-10-CM | POA: Diagnosis not present

## 2024-01-16 DIAGNOSIS — E663 Overweight: Secondary | ICD-10-CM | POA: Diagnosis not present

## 2024-01-16 DIAGNOSIS — R7303 Prediabetes: Secondary | ICD-10-CM | POA: Diagnosis not present

## 2024-01-16 DIAGNOSIS — E785 Hyperlipidemia, unspecified: Secondary | ICD-10-CM | POA: Diagnosis not present

## 2024-01-16 DIAGNOSIS — I10 Essential (primary) hypertension: Secondary | ICD-10-CM | POA: Diagnosis not present

## 2024-02-12 ENCOUNTER — Other Ambulatory Visit: Payer: Self-pay | Admitting: Family Medicine

## 2024-02-12 DIAGNOSIS — Z1231 Encounter for screening mammogram for malignant neoplasm of breast: Secondary | ICD-10-CM

## 2024-03-06 ENCOUNTER — Other Ambulatory Visit: Payer: Self-pay | Admitting: Medical Genetics

## 2024-03-11 ENCOUNTER — Ambulatory Visit: Admission: RE | Admit: 2024-03-11 | Discharge: 2024-03-11 | Disposition: A | Source: Ambulatory Visit

## 2024-03-11 DIAGNOSIS — Z1231 Encounter for screening mammogram for malignant neoplasm of breast: Secondary | ICD-10-CM

## 2024-05-09 ENCOUNTER — Other Ambulatory Visit
